# Patient Record
Sex: Female | Born: 1985 | Race: White | Hispanic: No | Marital: Married | Smoking: Never smoker
Health system: Southern US, Community
[De-identification: ages and names within clinical notes are randomized; demographics above are authoritative.]

## PROBLEM LIST (undated history)

## (undated) DIAGNOSIS — D649 Anemia, unspecified: Secondary | ICD-10-CM

## (undated) DIAGNOSIS — T7840XA Allergy, unspecified, initial encounter: Secondary | ICD-10-CM

## (undated) DIAGNOSIS — S0300XA Dislocation of jaw, unspecified side, initial encounter: Secondary | ICD-10-CM

## (undated) HISTORY — DX: Allergy, unspecified, initial encounter: T78.40XA

## (undated) HISTORY — DX: Anemia, unspecified: D64.9

---

## 2008-09-10 ENCOUNTER — Ambulatory Visit (HOSPITAL_BASED_OUTPATIENT_CLINIC_OR_DEPARTMENT_OTHER): Admission: RE | Admit: 2008-09-10 | Discharge: 2008-09-10 | Payer: Self-pay | Admitting: General Surgery

## 2008-09-10 ENCOUNTER — Encounter (INDEPENDENT_AMBULATORY_CARE_PROVIDER_SITE_OTHER): Payer: Self-pay | Admitting: General Surgery

## 2009-12-28 ENCOUNTER — Emergency Department (HOSPITAL_BASED_OUTPATIENT_CLINIC_OR_DEPARTMENT_OTHER): Admission: EM | Admit: 2009-12-28 | Discharge: 2009-12-28 | Payer: Self-pay | Admitting: Emergency Medicine

## 2010-04-05 ENCOUNTER — Emergency Department (HOSPITAL_COMMUNITY): Admission: EM | Admit: 2010-04-05 | Discharge: 2010-04-06 | Payer: Self-pay | Admitting: Emergency Medicine

## 2011-01-04 LAB — URINE MICROSCOPIC-ADD ON

## 2011-01-04 LAB — DIFFERENTIAL
Basophils Absolute: 0 10*3/uL (ref 0.0–0.1)
Basophils Relative: 0 % (ref 0–1)
Eosinophils Absolute: 0 10*3/uL (ref 0.0–0.7)
Eosinophils Relative: 0 % (ref 0–5)
Lymphocytes Relative: 10 % — ABNORMAL LOW (ref 12–46)
Monocytes Absolute: 1.1 10*3/uL — ABNORMAL HIGH (ref 0.1–1.0)

## 2011-01-04 LAB — CBC
HCT: 32 % — ABNORMAL LOW (ref 36.0–46.0)
Hemoglobin: 10.4 g/dL — ABNORMAL LOW (ref 12.0–15.0)
RBC: 3.59 MIL/uL — ABNORMAL LOW (ref 3.87–5.11)
RDW: 13.5 % (ref 11.5–15.5)
WBC: 10.4 10*3/uL (ref 4.0–10.5)

## 2011-01-04 LAB — COMPREHENSIVE METABOLIC PANEL
AST: 18 U/L (ref 0–37)
Alkaline Phosphatase: 60 U/L (ref 39–117)
BUN: 5 mg/dL — ABNORMAL LOW (ref 6–23)
CO2: 26 mEq/L (ref 19–32)
Calcium: 8.9 mg/dL (ref 8.4–10.5)
Chloride: 104 mEq/L (ref 96–112)
Creatinine, Ser: 0.87 mg/dL (ref 0.4–1.2)
GFR calc non Af Amer: >60 mL/min (ref >60)
Glucose, Bld: 107 mg/dL — ABNORMAL HIGH (ref 70–99)
Potassium: 4 mEq/L (ref 3.5–5.1)
Total Bilirubin: 0.4 mg/dL (ref 0.3–1.2)
Total Protein: 6.8 g/dL (ref 6.0–8.3)

## 2011-01-04 LAB — LIPASE, BLOOD: Lipase: 20 U/L (ref 11–59)

## 2011-01-04 LAB — URINALYSIS, ROUTINE W REFLEX MICROSCOPIC
Ketones, ur: NEGATIVE mg/dL
Protein, ur: NEGATIVE mg/dL
Urobilinogen, UA: 0.2 mg/dL (ref 0.0–1.0)

## 2011-01-04 LAB — URINE CULTURE: Colony Count: 85000

## 2011-03-03 NOTE — Op Note (Signed)
NAMENORVA, BOWE NO.:  [id number]   MEDICAL RECORD NO.:  [id number]          PATIENT TYPE:  AMB   LOCATION:  DSC                          FACILITY:  [facility]   PHYSICIAN:  Anselm Pancoast. 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   OPERATION:  Re-excision of old scar.   ANESTHESIA:  Local anesthesia with oral sedation.   SURGEON:  Anselm Pancoast. 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  She had been seen in the [facility].  I think they had also done an ultrasound of the  breast, and it showed a little nodule in the immediate subcutaneous  tissue and skin in this area.  I saw her in the office and recommended  that we just excise the area thinking it was a sebaceous cyst, and when  I made a little ellipse, the area popped out very quickly, and on  opening the area, it was firm and not the cheesy normally sebaceous-cyst  type fluid.  I sent the same for pathology examination, and Dr. Cliffton Asters is  the Pathologist who is the examiner, and he said this is a fibrous  histiocytoma, it is not a malignant variation, and questioned whether it  was completely excised.  The area has healed without any problems.  There was a just a little bit of thickening in the underlying skin, but  with this area of question, recommended that we re-excise it with  margins, and she is here today for the planned procedure.  This at  present is a little incision about a  centimeter and a half in size that  has maybe a little thickening in the immediate area under it but  certainly not an obvious mass and not anything that looks like an  inflammation.  I have discussed with the patient's mother about what we  are planning to do, and mother did come with her today.  We also had  given the patient 2 mg of Valium orally about an hour earlier, and she  is comfortable but talking and appears to be under a light sedation.  The area then after being marked, the patient identified, and time-out  procedure, etc., we draped her sterilely and used about 10 mL of 1%  Xylocaine with adrenaline that I infiltrated in the tissue around the  area.  I then basically ellipsed out the old scar, and the immediate  subcutaneous tissue was under the skin going down, exited to the breast  tissue capsule, but this was not a breast area itself.  After the area  had been completely excised, then we could pull the subcutaneous tissue  over with 4-0 Vicryl and then closed the skin with subcuticular 5-0  Monocryl and then benzoin and Steri-Strips on the skin.  There was  minimal bleeding.  The patient tolerated the procedure without any  problem, and she is ready to be released.  I will see her back in the  office in about 10 days, and she has some extra Steri-Strips, which she  can replace again and hopefully, have as minimal scar as possible.  The specimen was sent for repeat pathology examination, and she was  given some Vicodin tablets if needed for pain.  She will limit her  activities today, but she should be able to attend class tomorrow on  Wednesday and then home for the holidays.           ______________________________  Anselm Pancoast. Zachery Dakins, M.D.     WJW/MEDQ  D:  09/10/2008  T:  09/11/2008  Job:  [id number]

## 2014-10-08 ENCOUNTER — Other Ambulatory Visit: Payer: Self-pay | Admitting: Dentistry

## 2014-10-08 DIAGNOSIS — R6884 Jaw pain: Secondary | ICD-10-CM

## 2014-10-24 ENCOUNTER — Ambulatory Visit
Admission: RE | Admit: 2014-10-24 | Discharge: 2014-10-24 | Disposition: A | Discharge status: Home / Self Care | Payer: BLUE CROSS/BLUE SHIELD | Source: Ambulatory Visit | Attending: Dentistry | Admitting: Dentistry

## 2014-10-24 DIAGNOSIS — R6884 Jaw pain: Secondary | ICD-10-CM

## 2015-12-05 ENCOUNTER — Emergency Department (HOSPITAL_BASED_OUTPATIENT_CLINIC_OR_DEPARTMENT_OTHER)
Admission: EM | Admit: 2015-12-05 | Discharge: 2015-12-06 | Disposition: A | Discharge status: Home / Self Care | Payer: BLUE CROSS/BLUE SHIELD | Attending: Emergency Medicine | Admitting: Emergency Medicine

## 2015-12-05 ENCOUNTER — Emergency Department (HOSPITAL_BASED_OUTPATIENT_CLINIC_OR_DEPARTMENT_OTHER): Payer: BLUE CROSS/BLUE SHIELD

## 2015-12-05 ENCOUNTER — Encounter (HOSPITAL_BASED_OUTPATIENT_CLINIC_OR_DEPARTMENT_OTHER): Payer: Self-pay | Admitting: Emergency Medicine

## 2015-12-05 DIAGNOSIS — R102 Pelvic and perineal pain: Secondary | ICD-10-CM | POA: Insufficient documentation

## 2015-12-05 DIAGNOSIS — Z3202 Encounter for pregnancy test, result negative: Secondary | ICD-10-CM | POA: Diagnosis not present

## 2015-12-05 DIAGNOSIS — Z87828 Personal history of other (healed) physical injury and trauma: Secondary | ICD-10-CM | POA: Diagnosis not present

## 2015-12-05 DIAGNOSIS — R1030 Lower abdominal pain, unspecified: Secondary | ICD-10-CM | POA: Insufficient documentation

## 2015-12-05 DIAGNOSIS — N898 Other specified noninflammatory disorders of vagina: Secondary | ICD-10-CM | POA: Insufficient documentation

## 2015-12-05 HISTORY — DX: Dislocation of jaw, unspecified side, initial encounter: S03.00XA

## 2015-12-05 LAB — WET PREP, GENITAL
Clue Cells Wet Prep HPF POC: NONE SEEN
TRICH WET PREP: NONE SEEN
Yeast Wet Prep HPF POC: NONE SEEN

## 2015-12-05 LAB — URINALYSIS, ROUTINE W REFLEX MICROSCOPIC
Bilirubin Urine: NEGATIVE
GLUCOSE, UA: NEGATIVE mg/dL
Hgb urine dipstick: NEGATIVE
KETONES UR: NEGATIVE mg/dL
LEUKOCYTES UA: NEGATIVE
Nitrite: NEGATIVE
PH: 7 (ref 5.0–8.0)
Protein, ur: NEGATIVE mg/dL
SPECIFIC GRAVITY, URINE: 1.008 (ref 1.005–1.030)

## 2015-12-05 LAB — PREGNANCY, URINE: Preg Test, Ur: NEGATIVE

## 2015-12-05 MED ORDER — KETOROLAC TROMETHAMINE 60 MG/2ML IM SOLN
60.0000 mg | Freq: Once | INTRAMUSCULAR | Status: AC
  Administered 2015-12-06: 60 mg via INTRAMUSCULAR
  Filled 2015-12-05: qty 2

## 2015-12-05 NOTE — ED Provider Notes (Signed)
CSN: [id number]     Arrival date & time 12/05/15  2103 History  By signing my name below, I, Debra Raymond, attest that this documentation has been prepared under the direction and in the presence of Baruch Lewers, MD. Electronically Signed: Phillis Raymond, ED Scribe. 12/05/2015. 11:14 PM.  Chief Complaint  Patient presents with  . Pelvic Pain   Patient is a 30 y.o. female presenting with abdominal pain. The history is provided by the patient. No language interpreter was used.  Abdominal Pain Pain location:  Suprapubic Pain quality: cramping   Pain radiates to:  Does not radiate Pain severity:  Moderate Onset quality:  Gradual Timing:  Constant Progression:  Unchanged Chronicity:  New Context: not alcohol use   Relieved by:  Nothing Worsened by:  Nothing tried Ineffective treatments:  None tried Associated symptoms: no chest pain, no chills, no constipation, no diarrhea, no dysuria, no fever, no hematuria, no nausea, no shortness of breath and no vomiting   HPI Comments: Debra Raymond is a 30 y.o. female who presents to the Emergency Department complaining of lower, central pelvic pain onset 3 hours ago. Pt states that she is not currently on her menstrual period, but began to have mild pain when her period first started 10 days ago. She has not taken anything for her pain. She has a copper IUD in place. Last BM was this morning. She denies fever, chills, constipation, vomiting, diarrhea, frequency, dysuria, hematuria, or malodorous urine.   Past Medical History  Diagnosis Date  . TMJ (dislocation of temporomandibular joint)    History reviewed. No pertinent past surgical history. History reviewed. No pertinent family history. Social History  Substance Use Topics  . Smoking status: Never Smoker   . Smokeless tobacco: None  . Alcohol Use: Yes     Comment: occ   OB History    No data available     Review of Systems  Constitutional: Negative for fever and chills.  Respiratory:  Negative for shortness of breath.   Cardiovascular: Negative for chest pain.  Gastrointestinal: Positive for abdominal pain. Negative for nausea, vomiting, diarrhea and constipation.  Genitourinary: Positive for pelvic pain. Negative for dysuria, urgency, frequency, hematuria and menstrual problem.  All other systems reviewed and are negative.  Allergies  Corn-containing products; Dairy aid; and Gluten meal  Home Medications   Prior to Admission medications   Not on File   BP 125/87 mmHg  Pulse 78  Temp(Src) 98.7 F (37.1 C) (Oral)  Resp 18  Ht  (1.676 m)  Wt 133 lb 5 oz (60.47 kg)  BMI 21.53 kg/m2  SpO2 100%  LMP 11/18/2015 Physical Exam  Constitutional: She is oriented to person, place, and time. She appears well-developed and well-nourished. No distress.  HENT:  Head: Normocephalic and atraumatic.  Mouth/Throat: Oropharynx is clear and moist. No oropharyngeal exudate.  Trachea midline  Eyes: Conjunctivae and EOM are normal. Pupils are equal, round, and reactive to light.  Neck: Trachea normal and normal range of motion. Neck supple. No JVD present. Carotid bruit is not present.  Cardiovascular: Normal rate and regular rhythm.  Exam reveals no gallop and no friction rub.   No murmur heard. Pulmonary/Chest: Effort normal and breath sounds normal. No stridor. She has no wheezes. She has no rales.  Abdominal: Soft. She exhibits no mass. Bowel sounds are increased. There is no tenderness. There is no rebound and no guarding.  Genitourinary: Vaginal discharge found.  Chaperone present; no CMT; no strings present; white  discharge  Musculoskeletal: Normal range of motion.  Lymphadenopathy:    She has no cervical adenopathy.  Neurological: She is alert and oriented to person, place, and time. She has normal reflexes. No cranial nerve deficit. She exhibits normal muscle tone. Coordination normal.  Cranial nerves 2-12 intact  Skin: Skin is warm and dry. She is not diaphoretic.   Psychiatric: She has a normal mood and affect. Her behavior is normal.    ED Course  Procedures (including critical care time) DIAGNOSTIC STUDIES: Oxygen Saturation is 100% on RA, normal by my interpretation.    COORDINATION OF CARE: 11:14 PM-Discussed treatment plan which includes pelvic exam and UA with pt at bedside and pt agreed to plan.    Labs Review Labs Reviewed  URINALYSIS, ROUTINE W REFLEX MICROSCOPIC (NOT AT [facility])  PREGNANCY, URINE    Imaging Review No results found. I have personally reviewed and evaluated these images and lab results as part of my medical decision-making.   EKG Interpretation None      MDM   Final diagnoses:  None    IUd appears to be in position on XRay no free air, exam is benign.  IUD needs to be removed but strings no visualized nor palpable.  COntact your GYN.  Strict return precautions given  I personally performed the services described in this documentation, which was scribed in my presence. The recorded information has been reviewed and is accurate.     Cy Blamer, MD 12/06/15 [phone]

## 2015-12-05 NOTE — ED Notes (Signed)
Patient reports that she is not on her period, but having lower pelvic pain. She also reports that she has a copper IUD.

## 2015-12-06 LAB — GC/CHLAMYDIA PROBE AMP (~~LOC~~) NOT AT ARMC
CHLAMYDIA, DNA PROBE: NEGATIVE
NEISSERIA GONORRHEA: NEGATIVE

## 2015-12-06 MED ORDER — CEFTRIAXONE SODIUM 250 MG IJ SOLR
250.0000 mg | Freq: Once | INTRAMUSCULAR | Status: AC
  Administered 2015-12-06: 250 mg via INTRAMUSCULAR
  Filled 2015-12-06: qty 250

## 2015-12-06 MED ORDER — LIDOCAINE HCL (PF) 1 % IJ SOLN
INTRAMUSCULAR | Status: AC
  Administered 2015-12-06: 2.1 mL
  Filled 2015-12-06: qty 5

## 2015-12-06 MED ORDER — AZITHROMYCIN 1 G PO PACK
1.0000 g | PACK | Freq: Once | ORAL | Status: AC
  Administered 2015-12-06: 1 g via ORAL
  Filled 2015-12-06: qty 1

## 2015-12-06 MED ORDER — NAPROXEN 375 MG PO TABS: 375.0000 mg | ORAL_TABLET | Freq: Two times a day (BID) | ORAL | Status: DC

## 2015-12-06 NOTE — Discharge Instructions (Signed)
°Emergency Department Resource Guide °1) Find a Doctor and Pay Out of Pocket °Although you won't have to find out who is covered by your insurance plan, it is a good idea to ask around and get recommendations. You will then need to call the office and see if the doctor you have chosen will accept you as a new patient and what types of options they offer for patients who are self-pay. Some doctors offer discounts or will set up payment plans for their patients who do not have insurance, but you will need to ask so you aren't surprised when you get to your appointment. ° °2) Contact Your Local Health Department °Not all health departments have doctors that can see patients for sick visits, but many do, so it is worth a call to see if yours does. If you don't know where your local health department is, you can check in your phone book. The CDC also has a tool to help you locate your state's health department, and many state websites also have listings of all of their local health departments. ° °3) Find a Walk-in Clinic °If your illness is not likely to be very severe or complicated, you may want to try a walk in clinic. These are popping up all over the country in pharmacies, drugstores, and shopping centers. They're usually staffed by nurse practitioners or physician assistants that have been trained to treat common illnesses and complaints. They're usually fairly quick and inexpensive. However, if you have serious medical issues or chronic medical problems, these are probably not your best option. ° °No Primary Care Doctor: °- Call Health Connect at  832-8000 - they can help you locate a primary care doctor that  accepts your insurance, provides certain services, etc. °- Physician Referral Service- 1-800-533-3463 ° °Chronic Pain Problems: °Organization         Address  Phone   Notes  °[facility]  (336) 297-2271 Patients need to be referred by their primary care doctor.  ° °Medication  Assistance: °Organization         Address  Phone   Notes  °[place] [facility] [address] °[place], [place] [address] (336) 641-8030 --Must be a resident of [place] °-- Must have NO insurance coverage whatsoever (no Medicaid/ Medicare, etc.) °-- The pt. MUST have a primary care doctor that directs their care regularly and follows them in the community °  °[facility]  (866) 331-1348   °United Way  (888) 892-1162   ° °Agencies that provide inexpensive medical care: °Organization         Address  Phone   Notes  °[facility]  (336) 832-8035   ° Internal Medicine    (336) 832-7272   °[facility] [address] °[place], [place] [address] (336) 832-4777   °[facility] [address], °[place] (336) 271-4999   °[facility]    (336) 373-0678   °[facility]    (336) 272-1050   °[facility] ° [address], [place] Phone:  (336) 832-4444, Fax:  (336) 832-4440 Hours of Operation:  9 am - 6 pm, M-F.  Also accepts Medicaid/Medicare and self-pay.  °[facility] ° [address], [place] Phone: (336) 832-3150, Fax: (336) 832-3151. Hours of Operation:  8:30 am - 5:30 pm, M-F.  Also accepts Medicaid and self-pay.  °[facility] [place] [address], [place] Phone: (336) 878-6027   °[facility] [address], [place], [place] (336)723-1848, Ext. 123 Mondays & Thursdays: 7-9 AM.  First 15 patients are seen on a first come, first serve basis. °  ° °Medicaid-accepting [place] Providers: ° °Organization         Address  Phone   Notes  °[facility] [address], [place] (336) 641-2100 Also accepts self-pay patients.  °[facility] [address], [place] ° (336) 856-9996   °[facility] [address], [place]  (336) 288-8857   °[facility] [address] [place] [address], [place] (336) 299-7000   °Veita Bland [address], [place]  ° (336) 373-1557 Only accepts [place] Access Medicaid patients after they have their name applied to their card.  ° °Self-Pay (no insurance) in [place]: ° °Organization         Address  Phone   Notes  °Sickle Cell Patients, [facility] [address], [place] (336) 832-1970   °[facility] [address], [place] (336) 832-4400   °[facility] ° [address], [place] (336) 992-4800   °[facility]/Dr. Osei-Bonsu ° [address] [place] [address], [place] or [address], [place] (336) 841-8500 Phone number for both [place] and [place] locations is the same.  °[facility] [address], [place] (336) 299-0000   °[facility] [address] [place] [address], [place] or [address] (336) 852-7530 °(336) 878-2260   °[facility] [address], [place] (336) 350-1642, phone; (336) 294-5005, fax Sees patients 1st and 3rd Saturday of every month.  Must not qualify for public or private insurance (i.e. Medicaid, Medicare, [place] Health Choice, Veterans' Benefits) • Household income should be no more than 200% of the poverty level •The clinic cannot treat you if you are pregnant or think you are pregnant • Sexually transmitted diseases are not treated at the clinic.  ° ° °Dental Care: °Organization         Address  Phone  Notes  °[place] [facility] [address], [place] (336) 641-6152 Accepts children up to age 21 who are enrolled in Medicaid or [place] Health Choice; pregnant women with a Medicaid card; and children who have applied for Medicaid or [place] Health Choice, but were declined, whose parents can pay a reduced fee at time of service.  °[place]  [facility] [place]  [address], [place] (336) 641-7733 Accepts children up to age 21 who are enrolled in Medicaid or [place] Health Choice; pregnant women with a Medicaid card; and children who have applied for Medicaid or [place] Health Choice, but were declined, whose parents can pay a reduced fee at time of service.  °[facility] ° [address], [place] (336) 641-4533 Patients are seen by appointment only. Walk-ins are not accepted. [facility] will see patients 18 years of age and older. °Monday - Tuesday (8am-5pm) °Most Wednesdays (8:30-5pm) °$30 per visit, cash only  °[facility] ° [address], [place] (336) 641-4533 Patients are seen by appointment only. Walk-ins are not accepted. [facility] will see patients 18 years of age and older. °One   Wednesday Evening (Monthly: Volunteer Based).  $30 per visit, cash only  °[facility]  (919) 537-3737 for adults; Children under age 4, call Graduate Pediatric Dentistry at (919) [phone]. Children aged 4-14, please call (919) [phone] to request a pediatric application. ° Dental services are provided in all areas of dental care including fillings, crowns and bridges, complete and partial dentures, implants, gum treatment, root canals, and extractions. Preventive care is also provided. Treatment is provided to both adults and children. °Patients are selected via a lottery and there is often a waiting list. °  °[facility] [address], °[place] ° (336) 763-8833 [email or web address] °  °[facility] [address], [place], [place] (336)723-1848, Ext. 123 Second and Fourth Thursday of each month, opens at 6:30 AM; Clinic ends at 9 AM.  Patients are seen on a first-come first-served basis, and a limited number are seen during each clinic.  ° °[facility] ° [address], [place], [place] (336) 723-7904    Eligibility Requirements °You must have lived in [place], [place], or [place] counties for at least the last three months. °  You cannot be eligible for state or federal sponsored healthcare insurance, including Veterans Administration, Medicaid, or Medicare. °  You generally cannot be eligible for healthcare insurance through your employer.  °  How to apply: °Eligibility screenings are held every Tuesday and Wednesday afternoon from 1:00 pm until 4:00 pm. You do not need an appointment for the interview!  °[facility] [address], [place], [place] 336-631-2330   °[facility] [facility]  336-342-8273   °[facility]  336-703-3100   °[facility]  336-570-6415   ° °Behavioral Health Resources in the Community: °Intensive Outpatient Programs °Organization         Address  Phone  Notes  °[facility] [address], [place], [place] 336-878-6098   °[facility] [address], [place], [place] 336-832-9800   °[facility] [address], [place], [place] ° 336-882-2125   °[facility] [address],  °[place], [place] 1-800-853-5163 or 336-641-4981   °Substance Abuse Resources °Organization         Address  Phone  Notes  °[facility]  336-882-2125   °[facility]  336-784-9470   °[facility]  336-285-9073   °[facility]  336-845-3988   °Residential & Outpatient Substance Abuse Program  1-800-659-3381   °Psychological Services °Organization         Address  Phone  Notes  °[facility]  336- 832-9600   °[facility]  336- 378-7881   °[facility] [address], [place] 1-800-853-5163 or 336-641-4981   ° °Mobile Crisis Teams °Organization         Address  Phone  Notes  °[facility], Mobile Crisis Care Unit  1-877-626-1772   °Assertive °[facility] ° [address]  [place], [place] 336-834-9664   °Sharon DeEsch [address] °[place] 336-554-5454   ° °Self-Help/Support Groups °Organization         Address  Phone             Notes  °[facility]  - variety of support groups  336- 373-1402 Call for more information  °Narcotics Anonymous (NA), [facility] [address], °[place]  2 meetings at this location  ° °  Residential Treatment Programs °Organization         Address  Phone  Notes  °[facility] [address],    °[place]  1-866-801-8205   °[facility] ° [address], [place], [place] 704-293-8524   °[facility] [address], [place] 336-845-3988 Admissions: 8am-3pm M-F  °[facility] [address]    °[place], [place] 336-841-1104   °[facility] [address], [place], [place] 336-379-7146   °[facility] [address]  °[place], [place] 336-285-9073   °[facility] Intensive Outpatient [address], [place], [place] 336-852-3033   °[facility] [address]  °[place], [place] 1-877-615-2722 or 336-784-9470   °[facility] ([facility] [address] [place], [place] 336-227-7417 Accepts Medicaid  °[facility] [address]  °[place] 1-800-659-3381 Substance Abuse/Addiction Treatment  ° °[place] Behavioral Health Resources °Organization         Address  Phone  Notes  °[facility]  (888) 581-9988   °Julie Brannon, PhD [address] [place], [place]   (336) 349-5553 or (336) 951-0000   °[facility]   [address] °[place], [place] (336) 349-4454   °[facility] [address], [place], [place] (336) 342-8316 Insurance/Medicaid/sponsorship through [facility]  °[facility] [address]                                    [place], [place] (336) 342-8316 Therapy/tele-psych/case    °[facility] [address]  ° [place], [place] (336) 349-2233    °Dr. Arfeen  (336) 349-4544   °[facility] [place]  [employer] [place] [facility] 1) [address], [place] °2) [address], [place] °3)  [address], [place] (336) 349-3220 °(336) 342-7768 ° °(336) 342-8140   °[place] [facility] (336) 342-1394 or (336) 342-3537 (After Hours)    ° ° °

## 2015-12-06 NOTE — ED Notes (Signed)
Patient transported to X-ray 

## 2016-08-17 ENCOUNTER — Other Ambulatory Visit: Payer: Self-pay | Admitting: Obstetrics and Gynecology

## 2016-09-08 LAB — OB RESULTS CONSOLE GC/CHLAMYDIA
Chlamydia: NEGATIVE
Gonorrhea: NEGATIVE

## 2016-09-08 LAB — OB RESULTS CONSOLE RPR: RPR: NONREACTIVE

## 2016-09-08 LAB — OB RESULTS CONSOLE HEPATITIS B SURFACE ANTIGEN: Hepatitis B Surface Ag: NEGATIVE

## 2016-09-08 LAB — OB RESULTS CONSOLE ABO/RH: RH Type: POSITIVE

## 2016-09-08 LAB — OB RESULTS CONSOLE HIV ANTIBODY (ROUTINE TESTING): HIV: NONREACTIVE

## 2016-10-19 HISTORY — PX: TEMPOROMANDIBULAR JOINT SURGERY: SHX35

## 2016-10-19 NOTE — L&D Delivery Note (Signed)
Delivery Note Patient pushed well for 40 minutes.  At 1:19 PM a viable female was delivered via Vaginal, Spontaneous Delivery (Presentation: Occiput Anterior  ).  APGAR: 9, 9; weight pending .   Placenta status: Spontaneous, in tact.  Cord: 3V  with the following complications: None.  Cord pH: n/a  Anesthesia:  Epidural, 10 cc 1% lidocaine Episiotomy: None Lacerations: Labial, bilateral Suture Repair: 3.0 vicryl rapide Est. Blood Loss (mL): 300  Mom to postpartum.  Baby to Couplet care / Skin to Skin.  Debra Raymond Debra Raymond 03/29/2017, 1:49 PM

## 2017-03-11 ENCOUNTER — Other Ambulatory Visit: Payer: Self-pay | Admitting: Obstetrics and Gynecology

## 2017-03-11 LAB — OB RESULTS CONSOLE GBS: STREP GROUP B AG: NEGATIVE

## 2017-03-28 ENCOUNTER — Inpatient Hospital Stay (HOSPITAL_COMMUNITY)
Admission: AD | Admit: 2017-03-28 | Discharge: 2017-03-30 | DRG: 775 | Disposition: A | Discharge status: Home / Self Care | Payer: BLUE CROSS/BLUE SHIELD | Source: Ambulatory Visit | Attending: Obstetrics and Gynecology | Admitting: Obstetrics and Gynecology

## 2017-03-28 ENCOUNTER — Encounter (HOSPITAL_COMMUNITY): Payer: Self-pay | Admitting: Certified Nurse Midwife

## 2017-03-28 DIAGNOSIS — Z3A39 39 weeks gestation of pregnancy: Secondary | ICD-10-CM | POA: Diagnosis not present

## 2017-03-28 DIAGNOSIS — IMO0002 Reserved for concepts with insufficient information to code with codable children: Secondary | ICD-10-CM | POA: Diagnosis present

## 2017-03-28 DIAGNOSIS — Z3493 Encounter for supervision of normal pregnancy, unspecified, third trimester: Secondary | ICD-10-CM | POA: Diagnosis present

## 2017-03-28 LAB — POCT FERN TEST: POCT Fern Test: POSITIVE

## 2017-03-28 LAB — CBC
HCT: 33.5 % — ABNORMAL LOW (ref 36.0–46.0)
Hemoglobin: 11.5 g/dL — ABNORMAL LOW (ref 12.0–15.0)
MCH: 29.8 pg (ref 26.0–34.0)
MCHC: 34.3 g/dL (ref 30.0–36.0)
MCV: 86.8 fL (ref 78.0–100.0)
PLATELETS: 182 10*3/uL (ref 150–400)
RBC: 3.86 MIL/uL — ABNORMAL LOW (ref 3.87–5.11)
RDW: 12.8 % (ref 11.5–15.5)
WBC: 13.4 10*3/uL — ABNORMAL HIGH (ref 4.0–10.5)

## 2017-03-28 MED ORDER — LACTATED RINGERS IV SOLN: 500.0000 mL | INTRAVENOUS | Status: DC | PRN

## 2017-03-28 MED ORDER — LIDOCAINE HCL (PF) 1 % IJ SOLN
30.0000 mL | INTRAMUSCULAR | Status: DC | PRN
  Administered 2017-03-29: 30 mL via SUBCUTANEOUS
  Filled 2017-03-28: qty 30

## 2017-03-28 MED ORDER — SOD CITRATE-CITRIC ACID 500-334 MG/5ML PO SOLN
30.0000 mL | ORAL | Status: DC | PRN
  Administered 2017-03-29: 30 mL via ORAL
  Filled 2017-03-28: qty 15

## 2017-03-28 MED ORDER — LACTATED RINGERS IV SOLN
INTRAVENOUS | Status: DC
  Administered 2017-03-28 – 2017-03-29 (×2): via INTRAVENOUS

## 2017-03-28 MED ORDER — BUTORPHANOL TARTRATE 1 MG/ML IJ SOLN: 1.0000 mg | INTRAMUSCULAR | Status: DC | PRN

## 2017-03-28 MED ORDER — FLEET ENEMA 7-19 GM/118ML RE ENEM: 1.0000 | ENEMA | RECTAL | Status: DC | PRN

## 2017-03-28 MED ORDER — OXYTOCIN 40 UNITS IN LACTATED RINGERS INFUSION - SIMPLE MED
2.5000 [IU]/h | INTRAVENOUS | Status: DC
  Filled 2017-03-28: qty 1000

## 2017-03-28 MED ORDER — OXYTOCIN BOLUS FROM INFUSION
500.0000 mL | Freq: Once | INTRAVENOUS | Status: AC
  Administered 2017-03-29: 500 mL via INTRAVENOUS

## 2017-03-28 MED ORDER — ONDANSETRON HCL 4 MG/2ML IJ SOLN: 4.0000 mg | Freq: Four times a day (QID) | INTRAMUSCULAR | Status: DC | PRN

## 2017-03-28 NOTE — MAU Provider Note (Signed)
S: Debra Raymond is a 31 y.o. G1P0 at 1356w6d who presents today with leaking of fluid. She states that she had a large gush around 2130. She denies any VB. She confirms fetal movement. Only occasional contractions  O: VSS, afebrile Abdomen: soft, non-tender, gravid External: no lesion Vagina: large amount of pooling of clear fluid  Uterus: AGA FHT: 135, moderate with 15x15 accels, no decels  Toco: about every 5-7 mins  +FERN A/P: Exam for ROM RN will report to attending MD Thressa ShellerHeather Hogan 10:57 PM 03/28/17

## 2017-03-28 NOTE — MAU Note (Signed)
Patient presents to MAU with c/o rupture of membranes. States her water broke around 2130. Had some cramping this morning but no cramping or contractions since. No VB. 1cm in the office last week. + FM

## 2017-03-29 ENCOUNTER — Inpatient Hospital Stay (HOSPITAL_COMMUNITY): Payer: BLUE CROSS/BLUE SHIELD | Admitting: Anesthesiology

## 2017-03-29 ENCOUNTER — Encounter (HOSPITAL_COMMUNITY): Payer: Self-pay

## 2017-03-29 LAB — OB RESULTS CONSOLE RUBELLA ANTIBODY, IGM: RUBELLA: IMMUNE

## 2017-03-29 LAB — RPR: RPR: NONREACTIVE

## 2017-03-29 LAB — TYPE AND SCREEN
ABO/RH(D): A POS
Antibody Screen: NEGATIVE

## 2017-03-29 LAB — ABO/RH: ABO/RH(D): A POS

## 2017-03-29 MED ORDER — ONDANSETRON HCL 4 MG PO TABS: 4.0000 mg | ORAL_TABLET | ORAL | Status: DC | PRN

## 2017-03-29 MED ORDER — TERBUTALINE SULFATE 1 MG/ML IJ SOLN
0.2500 mg | Freq: Once | INTRAMUSCULAR | Status: DC | PRN
  Filled 2017-03-29: qty 1

## 2017-03-29 MED ORDER — PHENYLEPHRINE 40 MCG/ML (10ML) SYRINGE FOR IV PUSH (FOR BLOOD PRESSURE SUPPORT)
80.0000 ug | PREFILLED_SYRINGE | INTRAVENOUS | Status: DC | PRN
  Filled 2017-03-29: qty 5

## 2017-03-29 MED ORDER — SENNOSIDES-DOCUSATE SODIUM 8.6-50 MG PO TABS
2.0000 | ORAL_TABLET | ORAL | Status: DC
  Administered 2017-03-29: 2 via ORAL
  Filled 2017-03-29: qty 2

## 2017-03-29 MED ORDER — PHENYLEPHRINE 40 MCG/ML (10ML) SYRINGE FOR IV PUSH (FOR BLOOD PRESSURE SUPPORT)
80.0000 ug | PREFILLED_SYRINGE | INTRAVENOUS | Status: DC | PRN
  Filled 2017-03-29: qty 10
  Filled 2017-03-29: qty 5

## 2017-03-29 MED ORDER — FENTANYL 2.5 MCG/ML BUPIVACAINE 1/10 % EPIDURAL INFUSION (WH - ANES)
14.0000 mL/h | INTRAMUSCULAR | Status: DC | PRN
  Administered 2017-03-29: 14 mL/h via EPIDURAL
  Filled 2017-03-29: qty 100

## 2017-03-29 MED ORDER — COCONUT OIL OIL
1.0000 "application " | TOPICAL_OIL | Status: DC | PRN
  Administered 2017-03-30: 1 via TOPICAL
  Filled 2017-03-29: qty 120

## 2017-03-29 MED ORDER — OXYCODONE HCL 5 MG PO TABS: 5.0000 mg | ORAL_TABLET | ORAL | Status: DC | PRN

## 2017-03-29 MED ORDER — EPHEDRINE 5 MG/ML INJ
10.0000 mg | INTRAVENOUS | Status: DC | PRN
  Filled 2017-03-29: qty 2

## 2017-03-29 MED ORDER — BENZOCAINE-MENTHOL 20-0.5 % EX AERO
1.0000 "application " | INHALATION_SPRAY | CUTANEOUS | Status: DC | PRN
  Administered 2017-03-29: 1 via TOPICAL
  Filled 2017-03-29: qty 56

## 2017-03-29 MED ORDER — ONDANSETRON HCL 4 MG/2ML IJ SOLN: 4.0000 mg | INTRAMUSCULAR | Status: DC | PRN

## 2017-03-29 MED ORDER — LIDOCAINE HCL (PF) 1 % IJ SOLN
INTRAMUSCULAR | Status: DC | PRN
  Administered 2017-03-29: 4 mL
  Administered 2017-03-29: 6 mL via EPIDURAL

## 2017-03-29 MED ORDER — DIPHENHYDRAMINE HCL 50 MG/ML IJ SOLN: 12.5000 mg | INTRAMUSCULAR | Status: DC | PRN

## 2017-03-29 MED ORDER — OXYTOCIN 40 UNITS IN LACTATED RINGERS INFUSION - SIMPLE MED
1.0000 m[IU]/min | INTRAVENOUS | Status: DC
  Administered 2017-03-29: 2 m[IU]/min via INTRAVENOUS

## 2017-03-29 MED ORDER — LACTATED RINGERS IV SOLN: 500.0000 mL | Freq: Once | INTRAVENOUS | Status: DC

## 2017-03-29 MED ORDER — DIPHENHYDRAMINE HCL 25 MG PO CAPS: 25.0000 mg | ORAL_CAPSULE | Freq: Four times a day (QID) | ORAL | Status: DC | PRN

## 2017-03-29 MED ORDER — OXYCODONE HCL 5 MG PO TABS: 10.0000 mg | ORAL_TABLET | ORAL | Status: DC | PRN

## 2017-03-29 MED ORDER — PRENATAL MULTIVITAMIN CH
1.0000 | ORAL_TABLET | Freq: Every day | ORAL | Status: DC
  Administered 2017-03-29 – 2017-03-30 (×2): 1 via ORAL
  Filled 2017-03-29 (×2): qty 1

## 2017-03-29 MED ORDER — IBUPROFEN 600 MG PO TABS
600.0000 mg | ORAL_TABLET | Freq: Four times a day (QID) | ORAL | Status: DC
  Administered 2017-03-29 – 2017-03-30 (×4): 600 mg via ORAL
  Filled 2017-03-29 (×4): qty 1

## 2017-03-29 MED ORDER — DIBUCAINE 1 % RE OINT: 1.0000 "application " | TOPICAL_OINTMENT | RECTAL | Status: DC | PRN

## 2017-03-29 MED ORDER — SIMETHICONE 80 MG PO CHEW: 80.0000 mg | CHEWABLE_TABLET | ORAL | Status: DC | PRN

## 2017-03-29 MED ORDER — ACETAMINOPHEN 325 MG PO TABS: 650.0000 mg | ORAL_TABLET | ORAL | Status: DC | PRN

## 2017-03-29 MED ORDER — FAMOTIDINE 20 MG PO TABS
20.0000 mg | ORAL_TABLET | Freq: Two times a day (BID) | ORAL | Status: DC
  Administered 2017-03-30: 20 mg via ORAL
  Filled 2017-03-29 (×2): qty 1

## 2017-03-29 MED ORDER — WITCH HAZEL-GLYCERIN EX PADS: 1.0000 "application " | MEDICATED_PAD | CUTANEOUS | Status: DC | PRN

## 2017-03-29 MED ORDER — TETANUS-DIPHTH-ACELL PERTUSSIS 5-2.5-18.5 LF-MCG/0.5 IM SUSP: 0.5000 mL | Freq: Once | INTRAMUSCULAR | Status: DC

## 2017-03-29 NOTE — Progress Notes (Signed)
Comfortable w epidural  BP 109/71   Pulse 81   Temp 98.2 F (36.8 C) (Oral)   Resp 16   Ht 5\' 6"  (1.676 m)   Wt 74.4 kg (164 lb)   BMI 26.47 kg/m  NAD Abd: soft, gravid: EFW 6.5-7# Toco: q3-4 min EFM: 130s, mod var, occ variable decels w ctx.  + accels, + scalp stim w exam SVE: 5/90/-1  A/P:  G1 @ 5979w0d w PROM Cont IOL w pitocin FSR/vtx/gbs neg

## 2017-03-29 NOTE — Anesthesia Pain Management Evaluation Note (Signed)
  CRNA Pain Management Visit Note  Patient: Debra Raymond, 31 y.o., female  "Hello I am a member of the anesthesia team at [facility]. We have an anesthesia team available at all times to provide care throughout the hospital, including epidural management and anesthesia for C-section. I don't know your plan for the delivery whether it a natural birth, water birth, IV sedation, nitrous supplementation, doula or epidural, but we want to meet your pain goals."   1.Was your pain managed to your expectations on prior hospitalizations?   No prior hospitalizations  2.What is your expectation for pain management during this hospitalization?     Epidural  3.How can we help you reach that goal? Epidural imminent. L&D RN in room and aware of patient's desire for epidural.  Record the patient's initial score and the patient's pain goal.   Pain: 8  Pain Goal: 5 [facility] wants you to be able to say your pain was always managed very well.  Katlin Ciszewski L 03/29/2017

## 2017-03-29 NOTE — H&P (Signed)
31 y.o. G1P0 @ 703w0d presents with ROM at 2130 yesterday.  She was ruled in with a positive fern and large pooling.  Otherwise has good fetal movement and no bleeding.  SVE on admission was 1 cm.  She did not start contracting regularly after admission, so was started on pitocin augmentation at 0300.    Past Medical History:  Diagnosis Date  . TMJ (dislocation of temporomandibular joint)    History reviewed. No pertinent surgical history.  OB History  Gravida Para Term Preterm AB Living  1            SAB TAB Ectopic Multiple Live Births               # Outcome Date GA Lbr Len/2nd Weight Sex Delivery Anes PTL Lv  1 Current               Social History   Social History  . Marital status: Married    Spouse name: N/A  . Number of children: N/A  . Years of education: N/A   Occupational History  . Not on file.   Social History Main Topics  . Smoking status: Never Smoker  . Smokeless tobacco: Not on file  . Alcohol use Yes     Comment: occ  . Drug use: Unknown  . Sexual activity: Yes   Other Topics Concern  . Not on file   Social History Narrative  . No narrative on file   Corn-containing products; Dairy aid [lactase]; and Gluten meal    Prenatal Transfer Tool  Maternal Diabetes: No Genetic Screening: Normal Maternal Ultrasounds/Referrals: Normal Fetal Ultrasounds or other Referrals:  None Maternal Substance Abuse:  No Significant Maternal Medications:  Meds include: Zantac Significant Maternal Lab Results: Lab values include: Group B Strep negative, Other:  Zika virus IgM neg  ABO, Rh: --/--/A POS, A POS (06/10 2311) Antibody: NEG (06/10 2311) Rubella:  Immune RPR: Nonreactive (11/21 0000)  HBsAg: Negative (11/21 0000)  HIV: Non-reactive (11/21 0000)  GBS: Negative (05/24 0000)     Other PNC: uncomplicated.    Vitals:   03/29/17 0601 03/29/17 0631  BP: 111/65 112/71  Pulse: 68 63  Resp: 18 18  Temp: 98.3 F (36.8 C)      General:  NAD Abdomen:   soft, gravid Ex:  no edema SVE:  2/50/high per RN FHTs:  125, mod var, + accels, no decel Toco:  q3-4 min   A/P   31 y.o. G1P0 593w0d presents with PROM Cont pitocin augmentation Epidural now per pt request  FSR/ vtx/ GBS neg  Johan Antonacci GEFFEL Makoa Satz

## 2017-03-29 NOTE — Anesthesia Postprocedure Evaluation (Signed)
Anesthesia Post Note  Patient: Debra Raymond  Procedure(s) Performed: * No procedures listed *     Patient location during evaluation: Mother Baby Anesthesia Type: Epidural Level of consciousness: awake and alert and oriented Pain management: pain level controlled Vital Signs Assessment: post-procedure vital signs reviewed and stable Respiratory status: spontaneous breathing and nonlabored ventilation Cardiovascular status: stable Postop Assessment: no headache, patient able to bend at knees, no backache, no signs of nausea or vomiting, epidural receding and adequate PO intake Anesthetic complications: no    Last Vitals:  Vitals:   03/29/17 1540 03/29/17 1640  BP: 112/68 118/76  Pulse: 86 80  Resp: 18 18  Temp: 37.2 C     Last Pain:  Vitals:   03/29/17 1640  TempSrc:   PainSc: 2    Pain Goal:                 Land O'LakesMalinova,Ovid Witman Hristova

## 2017-03-29 NOTE — Lactation Note (Signed)
This note was copied from a baby's chart. Lactation Consultation Note  Patient Name: Debra Raymond ZOXWR'UToday's Date: 03/29/2017 Reason for consult: Initial assessment   Initial consult at 5 hrs old; GA 39.0; BW 6 lbs, 12.6 oz.  Mom is a P1 Infant has breastfed x2 (10-20 min) since birth; voids-0; stools-0; LS-9 by RN. Mom stated infant had just fed for 10 minutes on right side but had difficulty getting her latched to right. Reviewed hand expression with mom independently hand expressing as RN had already shown her; colostrum easily flowing and dripping from breast.  LC encouraged using colostrum rubbing onto nipple for breast care and pre- to start flow. LC offered help; mom consented.  Mom attempted latching swaddled infant in cradle hold with body not in alignment to moms and was allowing infant to self latch. LC taught cross-cradle hold, sandwiching of breast, and asymmetrical latching technique.  Taught dad how to assist using teacup hold. Infant easily latched, fed in a consistent sucking pattern for 20 minutes and then came off.  LS-8.   Parents very receptive to teaching and asked LC to show them how to use their [employer] DEBP they received from insurance company.   LC set up pump, but encouraged parents to read manual to figure out the different settings on the pump. Educated on size of infant's stomach, cluster feeding, and continuing to feed with feeding cues.  Encouraged to exclusively breastfeed to build a good milk supply. Lactation brochure given and informed of hospital support group and OP services. Encouraged to call for assistance as needed.     Maternal Data Has patient been taught Hand Expression?: Yes (independently HE; colostrum easily flowing and dripping) Does the patient have breastfeeding experience prior to this delivery?: No  Feeding Feeding Type: Breast Fed Length of feed: 20 min  LATCH Score/Interventions Latch: Grasps breast easily, tongue down, lips  flanged, rhythmical sucking.  Audible Swallowing: A few with stimulation Intervention(s): Skin to skin;Hand expression  Type of Nipple: Everted at rest and after stimulation  Comfort (Breast/Nipple): Soft / non-tender     Hold (Positioning): Assistance needed to correctly position infant at breast and maintain latch. Intervention(s): Breastfeeding basics reviewed;Support Pillows;Skin to skin  LATCH Score: 8  Lactation Tools Discussed/Used WIC Program: No   Consult Status Consult Status: Follow-up Date: 03/30/17 Follow-up type: In-patient    Debra Raymond, Debra Raymond 03/29/2017, 6:28 PM

## 2017-03-29 NOTE — Anesthesia Procedure Notes (Signed)
Epidural Patient location during procedure: OB  Staffing Anesthesiologist: Debra Raymond, Debra Raymond  Preanesthetic Checklist Completed: patient identified, site marked, surgical consent, pre-op evaluation, timeout performed, IV checked, risks and benefits discussed and monitors and equipment checked  Epidural Patient position: sitting Prep: site prepped and draped and DuraPrep Patient monitoring: continuous pulse ox and blood pressure Approach: midline Location: L2-L3 Injection technique: LOR air  Needle:  Needle type: Tuohy  Needle gauge: 17 G Needle length: 9 cm and 9 Needle insertion depth: 4 cm Catheter type: closed end flexible Catheter size: 19 Gauge Catheter at skin depth: 10 cm Test dose: negative  Assessment Events: blood not aspirated, injection not painful, no injection resistance, negative IV test and no paresthesia  Additional Notes Dosing of Epidural:  1st dose, through catheter ............................................Marland Kitchen.  Xylocaine 40 mg  2nd dose, through catheter, after waiting 3 minutes........Marland Kitchen.Xylocaine 60 mg    As each dose occurred, patient was free of IV sx; and patient exhibited no evidence of SA injection.  Patient is more comfortable after epidural dosed. Please see RN's note for documentation of vital signs,and FHR which are stable.  Patient reminded not to try to ambulate with numb legs, and that an RN must be present when she attempts to get up.

## 2017-03-29 NOTE — Anesthesia Preprocedure Evaluation (Signed)

## 2017-03-30 LAB — CBC
HEMATOCRIT: 31.8 % — AB (ref 36.0–46.0)
HEMOGLOBIN: 11 g/dL — AB (ref 12.0–15.0)
MCH: 30.2 pg (ref 26.0–34.0)
MCHC: 34.6 g/dL (ref 30.0–36.0)
MCV: 87.4 fL (ref 78.0–100.0)
Platelets: 184 10*3/uL (ref 150–400)
RBC: 3.64 MIL/uL — ABNORMAL LOW (ref 3.87–5.11)
RDW: 13.2 % (ref 11.5–15.5)
WBC: 15.2 10*3/uL — ABNORMAL HIGH (ref 4.0–10.5)

## 2017-03-30 MED ORDER — IBUPROFEN 600 MG PO TABS: 600.0000 mg | ORAL_TABLET | Freq: Four times a day (QID) | ORAL | 0 refills | Status: DC

## 2017-03-30 NOTE — Progress Notes (Signed)
PPD# 1 Pt and baby doing well. She would like to go home. VSSAF IMP/ Stable  Plan/ Will discharge

## 2017-03-30 NOTE — Lactation Note (Signed)
This note was copied from a baby's chart. Lactation Consultation Note: Mother reports that infant is feeding well. She also reports that her nipples are slightly sore. Advised mother to get infant latched on with good depth. Mother reports that infant is on well. She denies seeing any pinched or compressed areas. Mother encouraged to wait for infant to open mouth wide and to rotate positions frequently. Mother advised to cue base feed infant and to feed at least 8-12 times in 24 hours. Mother encouraged to do good breast massage and to ice breast when firm and full. Mother was given a harmony hand pump and advised to use to prepump as needed. Mother is aware of available LC services, [facility] and outpatient dept. Mother receptive to all teaching.   Patient Name: Girl Morey Hummingbirdara Reesman ZOXWR'UToday's Date: 03/30/2017 Reason for consult: Follow-up assessment   Maternal Data    Feeding Feeding Type: Breast Fed Length of feed: 25 min  LATCH Score/Interventions                      Lactation Tools Discussed/Used     Consult Status Consult Status: Complete    Michel BickersKendrick, Christena Sunderlin McCoy 03/30/2017, 11:28 AM

## 2017-03-30 NOTE — Discharge Summary (Signed)
Obstetric Discharge Summary Reason for Admission: rupture of membranes Prenatal Procedures: ultrasound Intrapartum Procedures: spontaneous vaginal delivery Postpartum Procedures: none Complications-Operative and Postpartum: bilateral labial tears Hemoglobin  Date Value Ref Range Status  03/30/2017 11.0 (L) 12.0 - 15.0 g/dL Final   HCT  Date Value Ref Range Status  03/30/2017 31.8 (L) 36.0 - 46.0 % Final    Physical Exam:  General: alert and cooperative Lochia: appropriate Uterine Fundus: firm   Discharge Diagnoses: Term Pregnancy-delivered  Discharge Information: Date: 03/30/2017 Activity: pelvic rest Diet: routine Medications: None and Ibuprofen Condition: stable Instructions: refer to practice specific booklet Discharge to: home Follow-up Information    Marlow Baarslark, Dyanna, MD. Schedule an appointment as soon as possible for a visit in 1 month(s).   Specialty:  Obstetrics Contact information: [address] [place] [address] [phone]           Newborn Data: Live born female  Birth Weight: 6 lb 12.6 oz (3080 g) APGAR: 9, 9  Home with mother.  Zaiyah Sottile E 03/30/2017, 8:47 AM

## 2017-05-12 ENCOUNTER — Telehealth (HOSPITAL_COMMUNITY): Payer: Self-pay | Admitting: Lactation Services

## 2017-05-12 NOTE — Telephone Encounter (Signed)
Mom called in and left message that she feels she is clogged in both breasts. She gave permission to leave a message. Her child was born in June. .   LM for mom in regards to plugged ducts and to feed/pump every 2 hours using moist warm compresses to areas for 10-20 minutes before pumping/feeding. Massage to the area with feeding. Pointing infant nose/chin toward clogged areas with feeding. Mentioned electric tooth brush/vibrator to the area to dislodge clogs.   Discussed that infrequent feeding, under wire bras, seat belts, carrying infant in the same position can be some of the causes of plugged ducts.   Advised mom to call OB if flu like symptoms or fever develops. Enc mom to call back with further questions/concens.

## 2020-02-25 ENCOUNTER — Emergency Department (INDEPENDENT_AMBULATORY_CARE_PROVIDER_SITE_OTHER): Payer: BC Managed Care – PPO

## 2020-02-25 ENCOUNTER — Emergency Department
Admission: EM | Admit: 2020-02-25 | Discharge: 2020-02-25 | Disposition: A | Discharge status: Home / Self Care | Payer: BLUE CROSS/BLUE SHIELD | Source: Home / Self Care | Attending: Family Medicine | Admitting: Family Medicine

## 2020-02-25 ENCOUNTER — Other Ambulatory Visit: Payer: Self-pay

## 2020-02-25 DIAGNOSIS — R109 Unspecified abdominal pain: Secondary | ICD-10-CM | POA: Diagnosis not present

## 2020-02-25 DIAGNOSIS — R1033 Periumbilical pain: Secondary | ICD-10-CM

## 2020-02-25 LAB — POCT CBC W AUTO DIFF (K'VILLE URGENT CARE)

## 2020-02-25 LAB — POCT URINE PREGNANCY: Preg Test, Ur: NEGATIVE

## 2020-02-25 NOTE — ED Provider Notes (Signed)
[facility]    CSN: [id number] Arrival date & time: 02/25/20  0950      History   Chief Complaint Chief Complaint  Patient presents with  . Abdominal Pain    HPI Debra Raymond is a 34 y.o. female.   Two weeks ago after performing some new and strenuous work-out activities, patient awoke with nausea, fatigue, and an episode of vomiting.  Her nausea persisted and she began to have increasing mid-abdominal radiating pain that is worse with physical activities such as lifting her 28 pound child.  Her pain has also become worse after eating, lasting for about 45 minutes.  She denies changes in bowel movements, but admits that she has abdominal cramps when initiating bowel movements.  She denies urinary symptoms.  No LMP recorded. (Menstrual status: IUD). She denies vaginal discharge and pelvic pain. She visited her PCP two days ago, where an abdominal U/S was negative, pregnancy test negative, CBC normal, and CMP normal.  She has had decreased nausea after beginning Zofran ODT. She reports a past history of celiac disease.  Her mother has colonic polyps and patient has been advised in the past to have a colonoscopy.  The history is provided by the patient.  Abdominal Pain Pain location:  Periumbilical Pain quality: cramping   Pain radiates to:  Does not radiate Pain severity:  Moderate Onset quality:  Sudden Duration:  2 weeks Timing:  Intermittent Progression:  Worsening Chronicity:  New Context: awakening from sleep and eating   Context: not alcohol use, not diet changes, not laxative use, not medication withdrawal, not previous surgeries, not recent illness, not recent travel, not sick contacts, not suspicious food intake and not trauma   Relieved by:  Nothing Worsened by:  Coughing, movement, palpation, deep breathing, position changes, bowel movements and eating Ineffective treatments:  None tried Associated symptoms: anorexia, nausea and vomiting   Associated  symptoms: no belching, no chest pain, no chills, no constipation, no cough, no diarrhea, no dysuria, no fatigue, no fever, no flatus, no hematemesis, no hematochezia, no hematuria, no melena, no shortness of breath, no sore throat, no vaginal bleeding and no vaginal discharge     Past Medical History:  Diagnosis Date  . TMJ (dislocation of temporomandibular joint)     Patient Active Problem List   Diagnosis Date Noted  . Rupture of membranes with clear amniotic fluid 03/28/2017    History reviewed. No pertinent surgical history.  OB History    Gravida  1   Para  1   Term  1   Preterm      AB      Living  1     SAB      TAB      Ectopic      Multiple  0   Live Births  1            Home Medications    Prior to Admission medications   Medication Sig Start Date End Date Taking? Authorizing Provider  ondansetron (ZOFRAN-ODT) 8 MG disintegrating tablet Take 8 mg by mouth every 8 (eight) hours as needed for nausea or vomiting.   Yes [provider]  traMADol (ULTRAM) 50 MG tablet Take 50 mg by mouth every 6 (six) hours as needed.   Yes [provider]  calcium carbonate (TUMS - DOSED IN MG ELEMENTAL CALCIUM) 500 MG chewable tablet Chew 2 tablets by mouth 2 (two) times daily as needed for indigestion or heartburn.  [provider]  ibuprofen (ADVIL,MOTRIN) 600 MG tablet Take 1 tablet (600 mg total) by mouth every 6 (six) hours. 03/30/17   Olga Millers, MD  Prenatal Vit-Fe Fumarate-FA (PRENATAL MULTIVITAMIN) TABS tablet Take 1 tablet by mouth daily at 12 noon.    [provider]    Family History Family History  Problem Relation Age of Onset  . Healthy Mother   . Diabetes Father   . Cancer Father        basal cell and melanoma    Social History Social History   Tobacco Use  . Smoking status: Never Smoker  . Smokeless tobacco: Never Used  Substance Use Topics  . Alcohol use: Yes    Comment: occ  . Drug use: Not  on file     Allergies   Corn-containing products, Dairy aid [lactase], and Gluten meal   Review of Systems Review of Systems  Constitutional: Positive for activity change, appetite change and unexpected weight change. Negative for chills, diaphoresis, fatigue and fever.  HENT: Negative for sore throat.   Eyes: Negative.   Respiratory: Negative for cough and shortness of breath.   Cardiovascular: Negative for chest pain.  Gastrointestinal: Positive for abdominal pain, anorexia, nausea and vomiting. Negative for abdominal distention, anal bleeding, blood in stool, constipation, diarrhea, flatus, hematemesis, hematochezia, melena and rectal pain.  Genitourinary: Negative for dysuria, hematuria, vaginal bleeding and vaginal discharge.  Musculoskeletal: Negative.   Skin: Negative.   Neurological: Negative.      Physical Exam Triage Vital Signs ED Triage Vitals  Enc Vitals Group     BP 02/25/20 1003 118/76     Pulse Rate 02/25/20 1003 73     Resp 02/25/20 1003 16     Temp 02/25/20 1003 98.4 F (36.9 C)     Temp Source 02/25/20 1003 Oral     SpO2 02/25/20 1003 100 %     Weight --      Height --      Head Circumference --      Peak Flow --      Pain Score 02/25/20 1000 5     Pain Loc --      Pain Edu? --      Excl. in [place]? --    No data found.  Updated Vital Signs BP 118/76 (BP Location: Right Arm)   Pulse 73   Temp 98.4 F (36.9 C) (Oral)   Resp 16   SpO2 100%   Visual Acuity Right Eye Distance:   Left Eye Distance:   Bilateral Distance:    Right Eye Near:   Left Eye Near:    Bilateral Near:     Physical Exam Vitals and nursing note reviewed.  Constitutional:      General: She is not in acute distress.    Appearance: She is not ill-appearing.  HENT:     Head: Normocephalic.     Right Ear: External ear normal.     Left Ear: External ear normal.     Nose: Nose normal.     Mouth/Throat:     Pharynx: Oropharynx is clear.  Eyes:     Conjunctiva/sclera:  Conjunctivae normal.     Pupils: Pupils are equal, round, and reactive to light.  Cardiovascular:     Rate and Rhythm: Normal rate.     Heart sounds: Normal heart sounds.  Pulmonary:     Breath sounds: Normal breath sounds.  Abdominal:     General: Abdomen is flat. Bowel sounds  are normal. There is no distension.     Palpations: Abdomen is soft. There is no hepatomegaly, splenomegaly or mass.     Tenderness: There is abdominal tenderness in the periumbilical area. There is no right CVA tenderness, left CVA tenderness or guarding. Negative signs include McBurney's sign.     Hernia: There is no hernia in the umbilical area.       Comments: Abdominal tenderness to palpation as noted on diagram.   Musculoskeletal:     Cervical back: Neck supple.     Right lower leg: No edema.     Left lower leg: No edema.  Lymphadenopathy:     Cervical: No cervical adenopathy.  Skin:    General: Skin is warm and dry.     Findings: No rash.  Neurological:     Mental Status: She is alert.      UC Treatments / Results  Labs (all labs ordered are listed, but only abnormal results are displayed) Labs Reviewed  TSH  POCT CBC W AUTO DIFF ([facility]):  WBC 4.3; LY 29.2; MO 2.9; GR 67.9; Hgb 13.9; Platelets 172   POCT URINE PREGNANCY negative    EKG   Radiology DG Abd 2 Views  Result Date: 02/25/2020 CLINICAL DATA:  Pain with nausea and vomiting EXAM: ABDOMEN - 2 VIEW COMPARISON:  December 06, 2015 FINDINGS: Supine and upright images were obtained. There is of moderate stool throughout the colon. There is no bowel dilatation or air-fluid level to suggest bowel obstruction. No free air. Lung bases are clear. There is an intrauterine device in the pelvis. IMPRESSION: No bowel obstruction or free air. Moderate stool in colon. Lung bases clear. Intrauterine device in mid pelvis. Electronically Signed   By: Bretta Bang III M.D.   On: 02/25/2020 11:30    Procedures Procedures (including  critical care time)  Medications Ordered in UC Medications - No data to display  Initial Impression / Assessment and Plan / UC Course  I have reviewed the triage vital signs and the nursing notes.  Pertinent labs & imaging results that were available during my care of the patient were reviewed by me and considered in my medical decision making (see chart for details).    Normal CBC reassuring.  Also note evaluation 2 days ago with negative abdominal U/S, negative pregnancy test, normal CBC, and normal CMP. Note unremarkable 2 view abdomen except for moderate stool in colon. Suspect constipation contributing to patient's symptoms. Will check TSH.  Begin Miralax. Because of her history of celiac disease and family history of colon polyps (mother), recommend follow-up/evaluation with GI.    Final Clinical Impressions(s) / UC Diagnoses   Final diagnoses:  Periumbilical abdominal pain     Discharge Instructions     Recommend taking daily Miralax.  Begin with approximately 1/2 capful mixed in 4 to 8 ounces of water until bowel movements are regular. Gradually increase dose to one capful daily.  Recommend increasing natural fiber in diet.  May also take a daily fiber product such as Citrucel with plenty of fluid.   If symptoms become significantly worse during the night or over the weekend, proceed to the local emergency room.    ED Prescriptions    None        Lattie Haw, MD 02/25/20 1339

## 2020-02-25 NOTE — Discharge Instructions (Addendum)
Recommend taking daily Miralax.  Begin with approximately 1/2 capful mixed in 4 to 8 ounces of water until bowel movements are regular. Gradually increase dose to one capful daily.  Recommend increasing natural fiber in diet.  May also take a daily fiber product such as Citrucel with plenty of fluid.   If symptoms become significantly worse during the night or over the weekend, proceed to the local emergency room.

## 2020-02-25 NOTE — ED Triage Notes (Signed)
Patient presents to Urgent Care with complaints of nausea, vomiting, and abdominal pain since two weeks ago. Patient reports she went to her PCP and they told her to be evaluated at [facility], where she was evaluated for gallstones (negative). Pt still does not feel well and does not know the cause.

## 2020-02-26 LAB — TSH: TSH: 0.68 mIU/L

## 2021-10-28 IMAGING — DX DG ABDOMEN 2V
2 series · 2 of 2 positions shown · non-contrast
Comparison: December 06, 2015

CLINICAL DATA: Pain with nausea and vomiting

EXAM:
ABDOMEN - 2 VIEW

[abdomen erect]
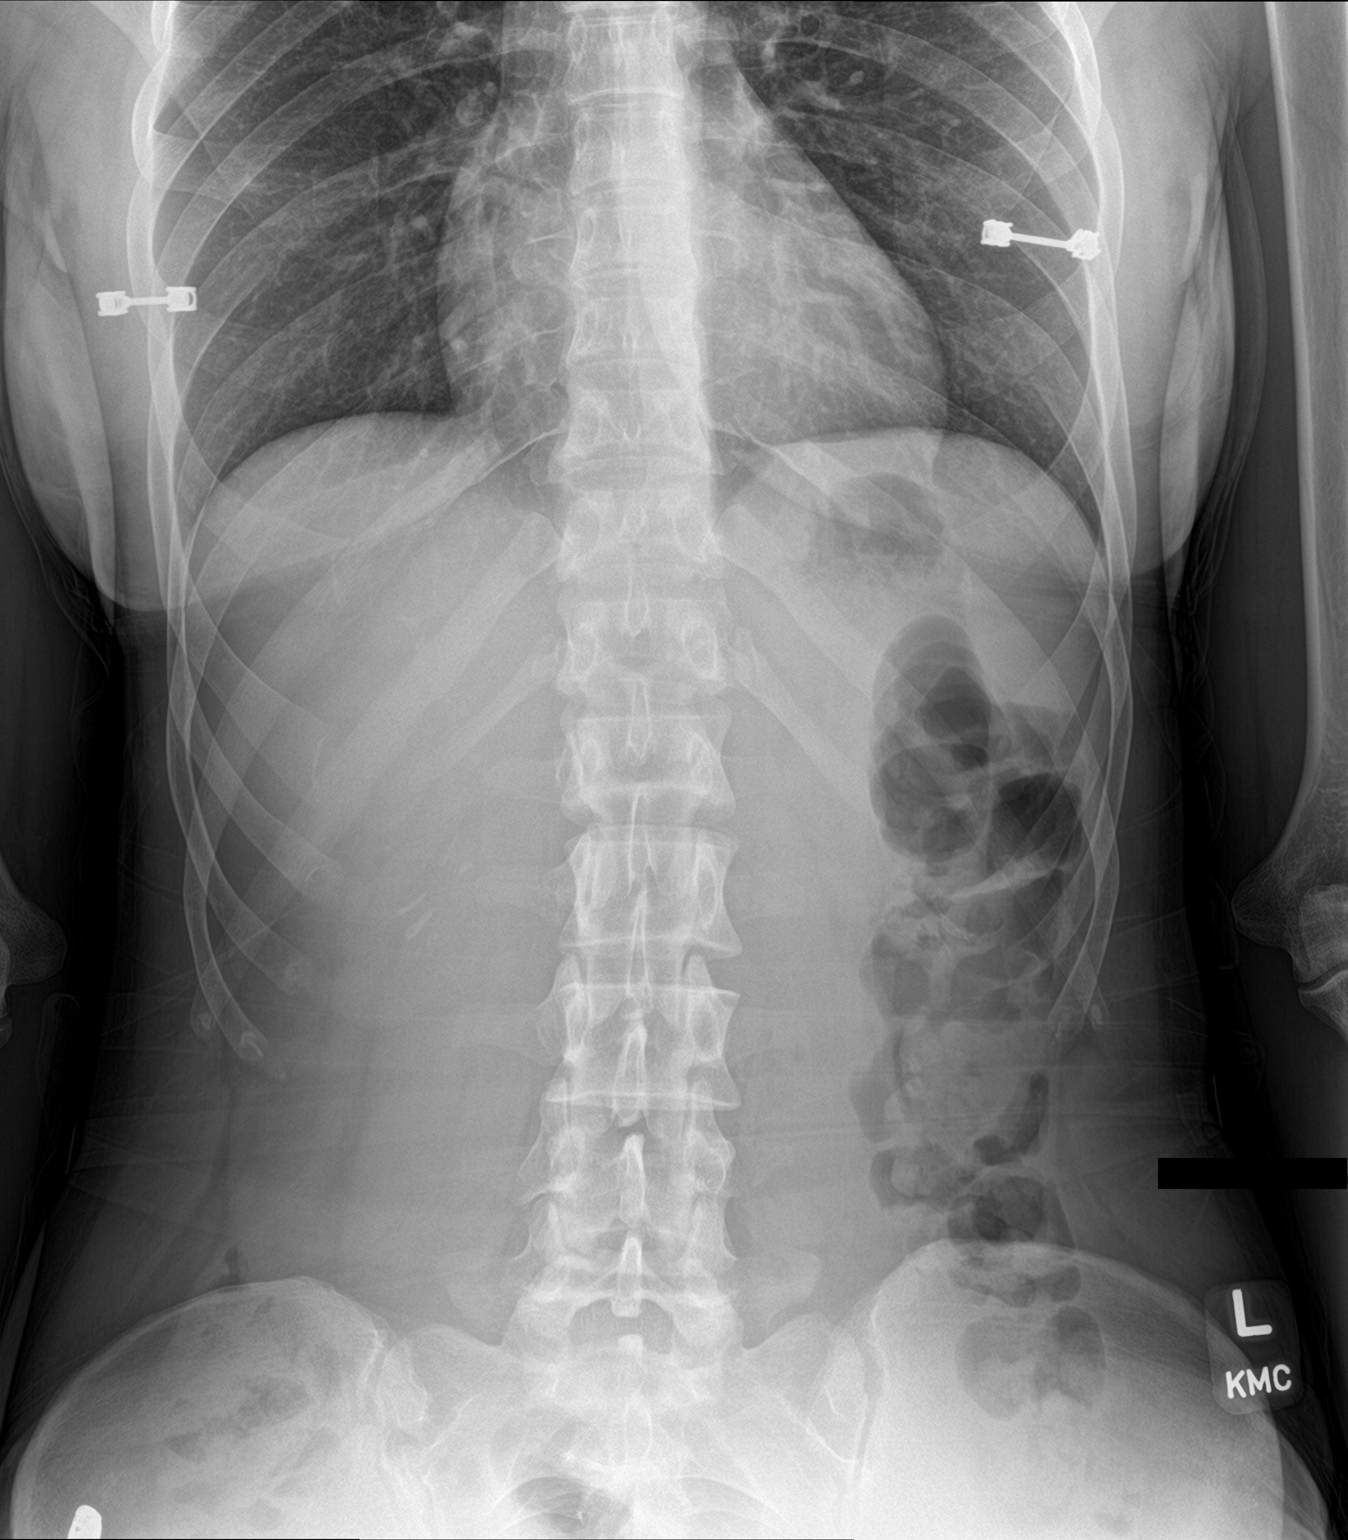

[abdomen supine]
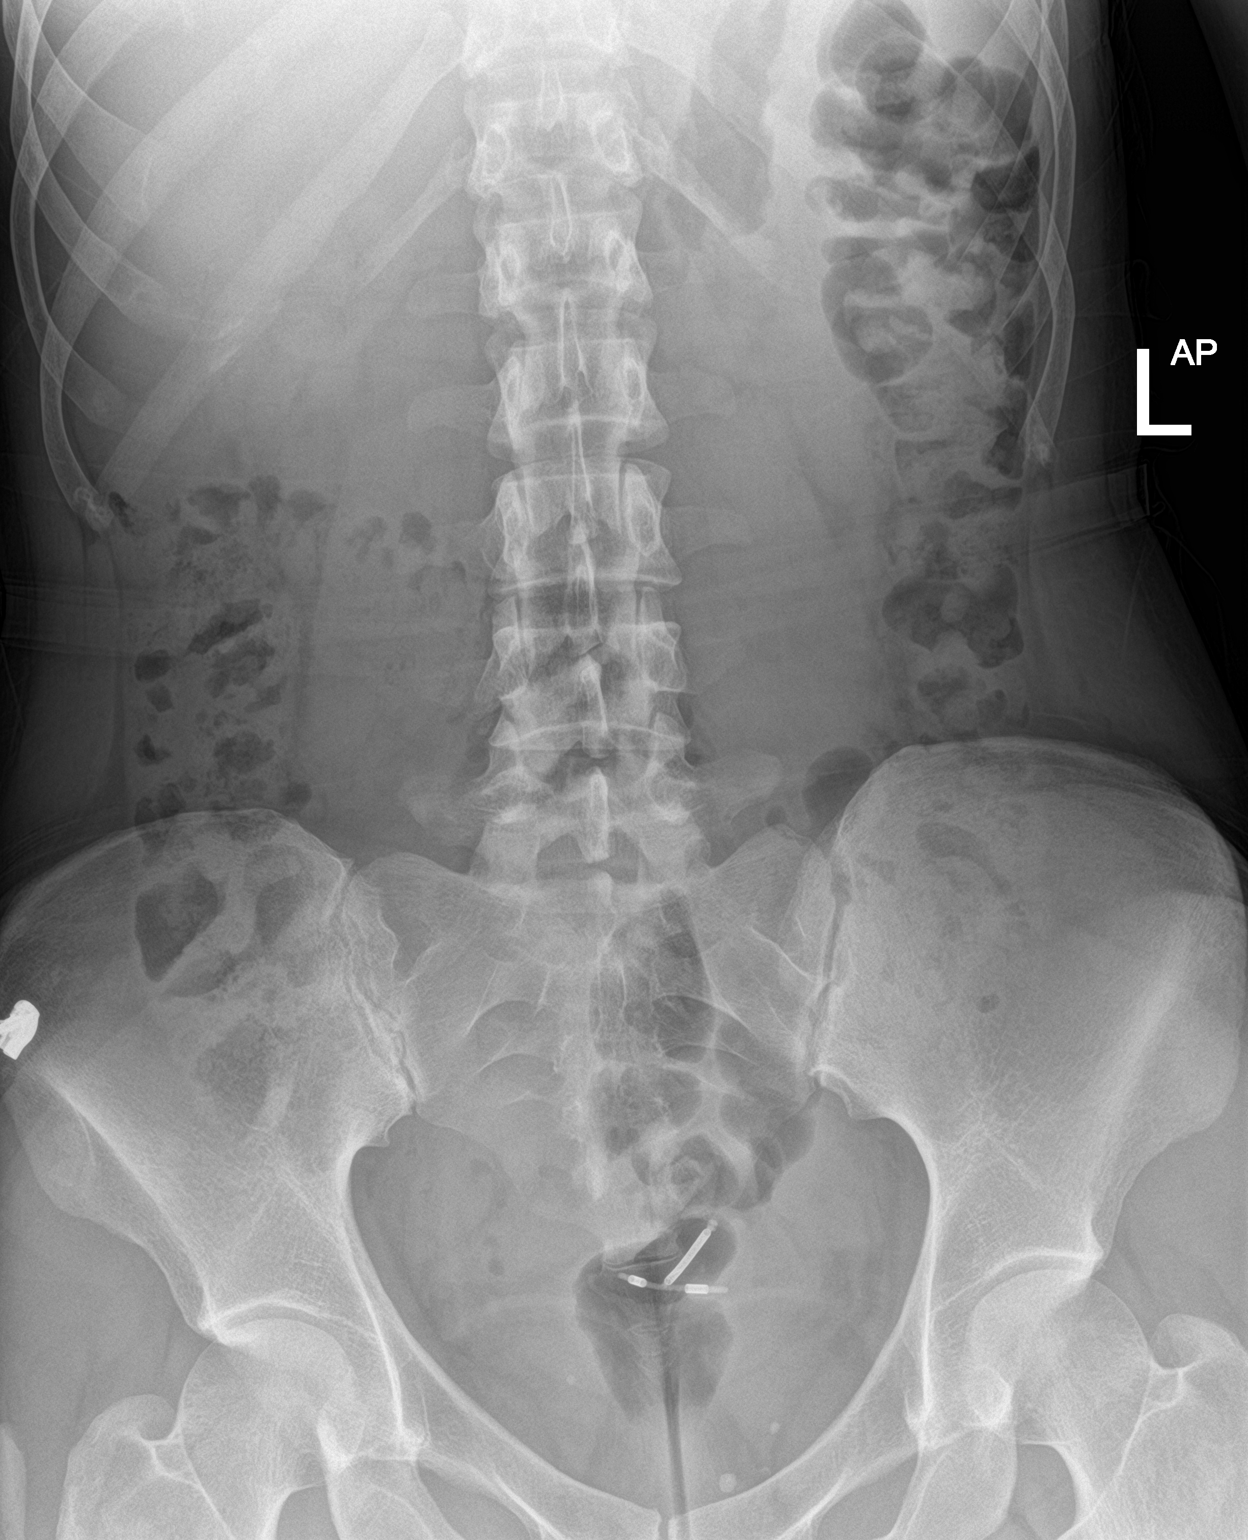

[2 of 2 positions shown; findings below may reference images not displayed]

FINDINGS: Supine and upright images were obtained. There is of moderate stool
throughout the colon. There is no bowel dilatation or air-fluid
level to suggest bowel obstruction. No free air. Lung bases are
clear. There is an intrauterine device in the pelvis.
IMPRESSION: No bowel obstruction or free air. Moderate stool in colon. Lung
bases clear. Intrauterine device in mid pelvis.

## 2022-01-08 ENCOUNTER — Encounter: Payer: Self-pay | Admitting: Emergency Medicine

## 2022-01-08 ENCOUNTER — Emergency Department
Admission: EM | Admit: 2022-01-08 | Discharge: 2022-01-08 | Disposition: A | Discharge status: Home / Self Care | Payer: BC Managed Care – PPO | Source: Home / Self Care | Attending: Family Medicine | Admitting: Family Medicine

## 2022-01-08 ENCOUNTER — Other Ambulatory Visit: Payer: Self-pay

## 2022-01-08 ENCOUNTER — Emergency Department (INDEPENDENT_AMBULATORY_CARE_PROVIDER_SITE_OTHER): Payer: BC Managed Care – PPO

## 2022-01-08 DIAGNOSIS — R1033 Periumbilical pain: Secondary | ICD-10-CM | POA: Diagnosis not present

## 2022-01-08 DIAGNOSIS — Z8719 Personal history of other diseases of the digestive system: Secondary | ICD-10-CM

## 2022-01-08 DIAGNOSIS — K5900 Constipation, unspecified: Secondary | ICD-10-CM

## 2022-01-08 LAB — POCT URINALYSIS DIP (MANUAL ENTRY)
Bilirubin, UA: NEGATIVE
Blood, UA: NEGATIVE
Glucose, UA: NEGATIVE mg/dL
Ketones, POC UA: NEGATIVE mg/dL
Leukocytes, UA: NEGATIVE
Nitrite, UA: NEGATIVE
Protein Ur, POC: NEGATIVE mg/dL
Spec Grav, UA: 1.01 (ref 1.010–1.025)
Urobilinogen, UA: 0.2 E.U./dL
pH, UA: 7 (ref 5.0–8.0)

## 2022-01-08 LAB — POCT URINE PREGNANCY: Preg Test, Ur: NEGATIVE

## 2022-01-08 MED ORDER — DICYCLOMINE HCL 20 MG PO TABS: 20.0000 mg | ORAL_TABLET | Freq: Two times a day (BID) | ORAL | 0 refills | Status: DC

## 2022-01-08 NOTE — ED Triage Notes (Signed)
Pt states this am she developed sudden sharp shooting pain behind her belly button that is intermittent and happens about every 10 minutes. Denies any strenous activity and GI symptoms. Normal BM this am.  ?

## 2022-01-08 NOTE — Discharge Instructions (Signed)
I have prescribed Bentyl to try for the abdominal cramping ?I recommend that you take a dose of MiraLAX and increase your water intake ?If you have increasing pain, nausea vomiting, or fever you may need to come back or go to the emergency room for additional imaging ?Sure you are drinking lots of water ?

## 2022-01-08 NOTE — ED Provider Notes (Addendum)
?[place] ? ? ? ?CSN: [id number] ?Arrival date & time: 01/08/22  1014 ? ? ?  ? ?History   ?Chief Complaint ?Chief Complaint  ?Patient presents with  ? Abdominal Pain  ? ? ?HPI ?Debra Raymond is a 36 y.o. female.  ? ?HPI ?Patient is a generally healthy 36 year old woman.  She states that this morning she developed abdominal pain.  It was right under her bellybutton.  It was quite severe.  It comes in waves.  It is not constant.  No nausea or vomiting.  No fever or chills.  She does have a history of IBS in the past.  Also has a history of celiac disease and is on a restricted diet.  She has not eaten anything that might be causing her symptoms.  She states she had a bowel movement this morning.  No urinary symptoms.  Patient has regular menstrual periods and is not pregnant. ?Past Medical History:  ?Diagnosis Date  ? TMJ (dislocation of temporomandibular joint)   ? ? ?Patient Active Problem List  ? Diagnosis Date Noted  ? Rupture of membranes with clear amniotic fluid 03/28/2017  ? ? ?History reviewed. No pertinent surgical history. ? ?OB History   ? ? Gravida  ?1  ? Para  ?1  ? Term  ?1  ? Preterm  ?   ? AB  ?   ? Living  ?1  ?  ? ? SAB  ?   ? IAB  ?   ? Ectopic  ?   ? Multiple  ?0  ? Live Births  ?1  ?   ?  ?  ? ? ? ?Home Medications   ? ?Prior to Admission medications   ?Medication Sig Start Date End Date Taking? Authorizing Provider  ?dicyclomine (BENTYL) 20 MG tablet Take 1 tablet (20 mg total) by mouth 2 (two) times daily. 01/08/22  Yes Raylene Everts, MD  ?ondansetron (ZOFRAN-ODT) 8 MG disintegrating tablet Take 8 mg by mouth every 8 (eight) hours as needed for nausea or vomiting.    [provider]  ?Prenatal Vit-Fe Fumarate-FA (PRENATAL MULTIVITAMIN) TABS tablet Take 1 tablet by mouth daily at 12 noon.    [provider]  ? ? ?Family History ?Family History  ?Problem Relation Age of Onset  ? Healthy Mother   ? Diabetes Father   ? Cancer Father   ?     basal cell and melanoma   ? ? ?Social History ?Social History  ? ?Tobacco Use  ? Smoking status: Never  ? Smokeless tobacco: Never  ?Substance Use Topics  ? Alcohol use: Yes  ?  Comment: occ  ? ? ? ?Allergies   ?Corn-containing products, Dairy aid [tilactase], and Gluten meal ? ? ?Review of Systems ?Review of Systems ? ?See HPI ?Physical Exam ?Triage Vital Signs ?ED Triage Vitals  ?Enc Vitals Group  ?   BP 01/08/22 1040 115/75  ?   Pulse Rate 01/08/22 1040 72  ?   Resp 01/08/22 1040 16  ?   Temp 01/08/22 1040 98.4 ?F (36.9 ?C)  ?   Temp Source 01/08/22 1040 Oral  ?   SpO2 01/08/22 1040 99 %  ?   Weight 01/08/22 1042 135 lb (61.2 kg)  ?   Height --   ?   Head Circumference --   ?   Peak Flow --   ?   Pain Score 01/08/22 1042 9  ?   Pain Loc --   ?  Pain Edu? --   ?   Excl. in [place]? --   ? ?No data found. ? ?Updated Vital Signs ?BP 115/75 (BP Location: Right Arm)   Pulse 72   Temp 98.4 ?F (36.9 ?C) (Oral)   Resp 16   Wt 61.2 kg   LMP 12/25/2021 Comment: approx 5 years copper IUD  SpO2 99%   Breastfeeding No   BMI 21.79 kg/m?  ?Physical Exam ?Constitutional:   ?   General: She is not in acute distress. ?   Appearance: She is well-developed and normal weight. She is not ill-appearing.  ?HENT:  ?   Head: Normocephalic and atraumatic.  ?   Mouth/Throat:  ?   Mouth: Mucous membranes are moist.  ?Eyes:  ?   Conjunctiva/sclera: Conjunctivae normal.  ?   Pupils: Pupils are equal, round, and reactive to light.  ?Cardiovascular:  ?   Rate and Rhythm: Normal rate and regular rhythm.  ?   Heart sounds: Normal heart sounds.  ?Pulmonary:  ?   Effort: Pulmonary effort is normal. No respiratory distress.  ?   Breath sounds: Normal breath sounds.  ?Abdominal:  ?   General: Abdomen is flat. Bowel sounds are normal. There is no distension.  ?   Palpations: Abdomen is soft. There is no hepatomegaly or splenomegaly.  ?   Tenderness: There is abdominal tenderness in the periumbilical area. There is no guarding or rebound.  ?Musculoskeletal:     ?   General:  Normal range of motion.  ?   Cervical back: Normal range of motion.  ?Skin: ?   General: Skin is warm and dry.  ?Neurological:  ?   General: No focal deficit present.  ?   Mental Status: She is alert.  ? ? ? ?UC Treatments / Results  ?Labs ?(all labs ordered are listed, but only abnormal results are displayed) ?Labs Reviewed  ?POCT URINALYSIS DIP (MANUAL ENTRY)  ?POCT URINE PREGNANCY  ? ? ?EKG ? ? ?Radiology ?DG Abdomen 1 View ? ?Result Date: 01/08/2022 ?CLINICAL DATA:  Peri umbilical pain EXAM: ABDOMEN - 1 VIEW COMPARISON:  02/25/2020 FINDINGS: Normal bowel gas pattern. Mild-to-moderate stool in the colon. Normal skeletal structures. No urinary tract calculi. IUD noted in the pelvis. IMPRESSION: Normal bowel gas pattern. Mild to moderate retained stool in the colon Electronically Signed   By: Franchot Gallo M.D.   On: 01/08/2022 11:40   ? ?Procedures ?Procedures (including critical care time) ? ?Medications Ordered in UC ?Medications - No data to display ? ?Initial Impression / Assessment and Plan / UC Course  ?I have reviewed the triage vital signs and the nursing notes. ? ?Pertinent labs & imaging results that were available during my care of the patient were reviewed by me and considered in my medical decision making (see chart for details). ? ?  ? ?Urinalysis is negative.  KUB is suggestive for constipation.  Patient appears comfortable although has a high pain tolerance.  Her pain is manageable at this time without additional imaging-I do not feel she has an acute abdomen.  Discussed home care and reasons for return ?Patient denies having similar problems previously, there is an office visit for periumbilical pain and May [id number] where she was evaluated.  Diagnosed with constipation.  Patient is advised ?Final Clinical Impressions(s) / UC Diagnoses  ? ?Final diagnoses:  ?Periumbilical abdominal pain  ?Constipation, unspecified constipation type  ?History of celiac disease  ? ? ? ?Discharge Instructions   ? ?  ?I  have  prescribed Bentyl to try for the abdominal cramping ?I recommend that you take a dose of MiraLAX and increase your water intake ?If you have increasing pain, nausea vomiting, or fever you may need to come back or go to the emergency room for additional imaging ?Sure you are drinking lots of water ? ? ?ED Prescriptions   ? ? Medication Sig Dispense Auth. Provider  ? dicyclomine (BENTYL) 20 MG tablet Take 1 tablet (20 mg total) by mouth 2 (two) times daily. 20 tablet Raylene Everts, MD  ? ?  ? ?PDMP not reviewed this encounter. ?  ?Raylene Everts, MD ?01/08/22 1354 ? ?  ?Raylene Everts, MD ?01/08/22 1356 ? ?

## 2022-07-29 ENCOUNTER — Encounter: Payer: Self-pay | Admitting: Internal Medicine

## 2022-09-14 ENCOUNTER — Encounter: Payer: Self-pay | Admitting: Internal Medicine

## 2022-09-14 ENCOUNTER — Ambulatory Visit (INDEPENDENT_AMBULATORY_CARE_PROVIDER_SITE_OTHER): Payer: BC Managed Care – PPO | Admitting: Internal Medicine

## 2022-09-14 ENCOUNTER — Other Ambulatory Visit (INDEPENDENT_AMBULATORY_CARE_PROVIDER_SITE_OTHER): Payer: BC Managed Care – PPO

## 2022-09-14 VITALS — BP 104/60 | HR 71 | Ht 65.0 in | Wt 138.8 lb

## 2022-09-14 DIAGNOSIS — K900 Celiac disease: Secondary | ICD-10-CM | POA: Diagnosis not present

## 2022-09-14 DIAGNOSIS — Z1211 Encounter for screening for malignant neoplasm of colon: Secondary | ICD-10-CM | POA: Diagnosis not present

## 2022-09-14 DIAGNOSIS — Z83719 Family history of colon polyps, unspecified: Secondary | ICD-10-CM

## 2022-09-14 LAB — CBC WITH DIFFERENTIAL/PLATELET
Basophils Absolute: 0 10*3/uL (ref 0.0–0.1)
Basophils Relative: 0.5 % (ref 0.0–3.0)
Eosinophils Absolute: 0.1 10*3/uL (ref 0.0–0.7)
Eosinophils Relative: 0.8 % (ref 0.0–5.0)
HCT: 39 % (ref 36.0–46.0)
Hemoglobin: 13.3 g/dL (ref 12.0–15.0)
Lymphocytes Relative: 22.1 % (ref 12.0–46.0)
Lymphs Abs: 2.1 10*3/uL (ref 0.7–4.0)
MCHC: 34.1 g/dL (ref 30.0–36.0)
MCV: 88.9 fl (ref 78.0–100.0)
Monocytes Absolute: 0.7 10*3/uL (ref 0.1–1.0)
Monocytes Relative: 7.8 % (ref 3.0–12.0)
Neutro Abs: 6.5 10*3/uL (ref 1.4–7.7)
Neutrophils Relative %: 68.8 % (ref 43.0–77.0)
Platelets: 194 10*3/uL (ref 150.0–400.0)
RBC: 4.39 Mil/uL (ref 3.87–5.11)
RDW: 12.2 % (ref 11.5–15.5)
WBC: 9.4 10*3/uL (ref 4.0–10.5)

## 2022-09-14 LAB — COMPREHENSIVE METABOLIC PANEL
ALT: 23 U/L (ref 0–35)
AST: 18 U/L (ref 0–37)
Albumin: 4.3 g/dL (ref 3.5–5.2)
Alkaline Phosphatase: 45 U/L (ref 39–117)
BUN: 18 mg/dL (ref 6–23)
CO2: 26 mEq/L (ref 19–32)
Calcium: 8.9 mg/dL (ref 8.4–10.5)
Chloride: 101 mEq/L (ref 96–112)
Creatinine, Ser: 1 mg/dL (ref 0.40–1.20)
GFR: 73.23 mL/min (ref >60.00)
Glucose, Bld: 95 mg/dL (ref 70–99)
Potassium: 3.9 mEq/L (ref 3.5–5.1)
Sodium: 135 mEq/L (ref 135–145)
Total Bilirubin: 0.3 mg/dL (ref 0.2–1.2)
Total Protein: 6.8 g/dL (ref 6.0–8.3)

## 2022-09-14 LAB — VITAMIN D 25 HYDROXY (VIT D DEFICIENCY, FRACTURES): VITD: 39.57 ng/mL (ref 30.00–100.00)

## 2022-09-14 LAB — IBC + FERRITIN
Ferritin: 21.7 ng/mL (ref 10.0–291.0)
Iron: 97 ug/dL (ref 42–145)
Saturation Ratios: 30.5 % (ref 20.0–50.0)
TIBC: 317.8 ug/dL (ref 250.0–450.0)
Transferrin: 227 mg/dL (ref 212.0–360.0)

## 2022-09-14 LAB — B12 AND FOLATE PANEL
Folate: 15.6 ng/mL (ref >5.9)
Vitamin B-12: 548 pg/mL (ref 211–911)

## 2022-09-14 MED ORDER — NA SULFATE-K SULFATE-MG SULF 17.5-3.13-1.6 GM/177ML PO SOLN: 1.0000 | Freq: Once | ORAL | 0 refills | Status: AC

## 2022-09-14 NOTE — Patient Instructions (Signed)
Your provider has requested that you go to the basement level for lab work before leaving today. Press "B" on the elevator. The lab is located at the first door on the left as you exit the elevator.  You have been scheduled for a colonoscopy. Please follow written instructions given to you at your visit today.  Please pick up your prep supplies at the pharmacy within the next 1-3 days. If you use inhalers (even only as needed), please bring them with you on the day of your procedure.  _______________________________________________________  If you are age 56 or older, your body mass index should be between 23-30. Your Body mass index is 23.1 kg/m. If this is out of the aforementioned range listed, please consider follow up with your Primary Care Provider.  If you are age 64 or younger, your body mass index should be between 19-25. Your Body mass index is 23.1 kg/m. If this is out of the aformentioned range listed, please consider follow up with your Primary Care Provider.   ________________________________________________________  The [place] [facility] providers would like to encourage you to use [facility] to communicate with providers for non-urgent requests or questions.  Due to long hold times on the telephone, sending your provider a message by [facility] may be a faster and more efficient way to get a response.  Please allow 48 business hours for a response.  Please remember that this is for non-urgent requests.  _______________________________________________________

## 2022-09-14 NOTE — Progress Notes (Signed)
   Chief Complaint: Family history of colon polyps  HPI : 36 year old female with history of celiac disease presents with family history of colon polyps  She was diagnosed with celiac disease about 15 years ago. She follows a gluten free diet and has had any issues if she follows the diet well. Her daughter also has celiac disease. Denies blood in stools, changes in bowel habits, diarrhea, constipation, weight loss, N&V, dysphagia, chest burning, or regurgitation. Denies prior colonoscopy. Mother had a large colon adenoma in her early 9s. Sister had precancerous colon polyps starting at age 31. Patient works as a Patent examiner. She does not follow up with a PCP regularly.  Past Medical History:  Diagnosis Date   TMJ (dislocation of temporomandibular joint)    Past Surgical History:  Procedure Laterality Date   TEMPOROMANDIBULAR JOINT SURGERY  2018   Family History  Problem Relation Age of Onset   Healthy Mother    Diabetes Father    Cancer Father        basal cell and melanoma   Social History   Tobacco Use   Smoking status: Never   Smokeless tobacco: Never  Substance Use Topics   Alcohol use: Yes    Alcohol/week: 7.0 standard drinks of alcohol    Types: 7 Glasses of wine per week    Comment: occ   Drug use: Not Currently   No current outpatient medications on file.   No current facility-administered medications for this visit.   Allergies  Allergen Reactions   Corn-Containing Products Other (See Comments)    GI upset   Dairy Aid [Tilactase] Other (See Comments)    GI upset   Gluten Meal Rash and Other (See Comments)    Mouth swelling and GI upset   Review of Systems: All systems reviewed and negative except where noted in HPI.   Physical Exam: BP 104/60   Pulse 71   Ht 5\' 5"  (1.651 m)   Wt 138 lb 12.8 oz (63 kg)   BMI 23.10 kg/m  Constitutional: Pleasant,well-developed, female in no acute distress. HEENT: Normocephalic and atraumatic.  Conjunctivae are normal. No scleral icterus. Cardiovascular: Normal rate, regular rhythm.  Pulmonary/chest: Effort normal and breath sounds normal. No wheezing, rales or rhonchi. Abdominal: Soft, nondistended, nontender. Bowel sounds active throughout. There are no masses palpable. No hepatomegaly. Extremities: No edema Neurological: Alert and oriented to person place and time. Skin: Skin is warm and dry. No rashes noted. Psychiatric: Normal mood and affect. Behavior is normal.  Labs 03/2020: CBC and CMP unremarkable. Vit D nml. TSH nml.   KUB 01/09/20: IMPRESSION: Normal bowel gas pattern. Mild to moderate retained stool in the colon  ASSESSMENT AND PLAN: Colon cancer screening Family history of colon polyps Celiac disease Patient presents for colon cancer screening due to a family history of advanced adenoma in her mother at age 29. Her sister also had precancerous polyps. Thus she would be due for colon cancer screening. I went over the colonoscopy procedure in detail with the patient, and she is agreeable to proceeding. Since patient has celiac disease and does not follow regularly with a PCP, will get her updated on her routine labs.  - Check CBC, CMP, vitamin D, folate, vitamin B12, ferritin/IBC - Colonoscopy [facility]  41, MD

## 2022-10-28 ENCOUNTER — Encounter: Payer: Self-pay | Admitting: Internal Medicine

## 2022-11-04 ENCOUNTER — Encounter: Payer: Self-pay | Admitting: Internal Medicine

## 2022-11-04 ENCOUNTER — Ambulatory Visit (AMBULATORY_SURGERY_CENTER): Payer: BC Managed Care – PPO | Admitting: Internal Medicine

## 2022-11-04 VITALS — BP 97/56 | HR 61 | Temp 98.2°F | Resp 12 | Ht 65.0 in | Wt 138.0 lb

## 2022-11-04 DIAGNOSIS — Z1211 Encounter for screening for malignant neoplasm of colon: Secondary | ICD-10-CM

## 2022-11-04 DIAGNOSIS — Z800 Family history of malignant neoplasm of digestive organs: Secondary | ICD-10-CM

## 2022-11-04 MED ORDER — SODIUM CHLORIDE 0.9 % IV SOLN: 500.0000 mL | INTRAVENOUS | Status: DC

## 2022-11-04 NOTE — Progress Notes (Signed)
Pt's states no medical or surgical changes since previsit or office visit. 

## 2022-11-04 NOTE — Progress Notes (Signed)
GASTROENTEROLOGY PROCEDURE H&P NOTE   Primary Care Physician: Jefm Petty, MD    Reason for Procedure:   Colon cancer screening, family history of advanced adenoma  Plan:    Colonoscopy  Patient is appropriate for endoscopic procedure(s) in the ambulatory ([place]) setting.  The nature of the procedure, as well as the risks, benefits, and alternatives were carefully and thoroughly reviewed with the patient. Ample time for discussion and questions allowed. The patient understood, was satisfied, and agreed to proceed.     HPI: Debra Raymond is a 37 y.o. female who presents for colonoscopy for evaluation of colon cancer screening and family history of advanced adenoma.  Patient was most recently seen in the Gastroenterology Clinic on 09/14/22.  No interval change in medical history since that appointment. Please refer to that note for full details regarding GI history and clinical presentation.   Past Medical History:  Diagnosis Date   Allergy    Anemia    TMJ (dislocation of temporomandibular joint)     Past Surgical History:  Procedure Laterality Date   TEMPOROMANDIBULAR JOINT SURGERY  2018    Prior to Admission medications   Not on File    No current outpatient medications on file.   Current Facility-Administered Medications  Medication Dose Route Frequency Provider Last Rate Last Admin   0.9 %  sodium chloride infusion  500 mL Intravenous Continuous Sharyn Creamer, MD        Allergies as of 11/04/2022 - Review Complete 11/04/2022  Allergen Reaction Noted   Corn-containing products Other (See Comments) 12/05/2015   Dairy aid [tilactase] Other (See Comments) 12/05/2015   Gluten meal Rash and Other (See Comments) 12/05/2015    Family History  Problem Relation Age of Onset   Colon cancer Mother    Healthy Mother    Diabetes Father    Cancer Father        basal cell and melanoma   Esophageal cancer Neg Hx    Rectal cancer Neg Hx    Stomach cancer Neg Hx      Social History   Socioeconomic History   Marital status: Married    Spouse name: Not on file   Number of children: Not on file   Years of education: Not on file   Highest education level: Not on file  Occupational History   Not on file  Tobacco Use   Smoking status: Never   Smokeless tobacco: Never  Substance and Sexual Activity   Alcohol use: Yes    Alcohol/week: 7.0 standard drinks of alcohol    Types: 7 Glasses of wine per week    Comment: occ   Drug use: Not Currently   Sexual activity: Yes  Other Topics Concern   Not on file  Social History Narrative   Not on file   Social Determinants of Health   Financial Resource Strain: Not on file  Food Insecurity: Not on file  Transportation Needs: Not on file  Physical Activity: Not on file  Stress: Not on file  Social Connections: Not on file  Intimate Partner Violence: Not on file    Physical Exam: Vital signs in last 24 hours: BP 110/74   Pulse 64   Temp 98.2 F (36.8 C)   Ht 5\' 5"  (1.651 m)   Wt 138 lb (62.6 kg)   SpO2 100%   BMI 22.96 kg/m  GEN: NAD EYE: Sclerae anicteric ENT: MMM CV: Non-tachycardic Pulm: No increased WOB GI: Soft NEURO:  Alert &  Oriented   Christia Reading, MD [facility]   11/04/2022 11:05 AM

## 2022-11-04 NOTE — Op Note (Signed)
[place] Patient Name: Debra Raymond Procedure Date: 11/04/2022 11:03 AM MRN: [id number] Endoscopist: Adline Mango [place] , , [id number] Age: 37 Referring MD:  Date of Birth: Jan 18, 1986 Gender: Female Account #: [id number] Procedure:                Colonoscopy Indications:              Colon cancer screening in patient with 1st-degree                            relative having advanced adenoma of the colon                            before age 88 Medicines:                Monitored Anesthesia Care Procedure:                Pre-Anesthesia Assessment:                           - Prior to the procedure, a History and Physical                            was performed, and patient medications and                            allergies were reviewed. The patient's tolerance of                            previous anesthesia was also reviewed. The risks                            and benefits of the procedure and the sedation                            options and risks were discussed with the patient.                            All questions were answered, and informed consent                            was obtained. Prior Anticoagulants: The patient has                            taken no anticoagulant or antiplatelet agents. ASA                            Grade Assessment: I - A normal, healthy patient.                            After reviewing the risks and benefits, the patient                            was deemed in satisfactory condition to undergo the  procedure.                           After obtaining informed consent, the colonoscope                            was passed under direct vision. Throughout the                            procedure, the patient's blood pressure, pulse, and                            oxygen saturations were monitored continuously. The                            PCF-HQ190L Colonoscope was introduced through the                             anus and advanced to the the cecum, identified by                            appendiceal orifice and ileocecal valve. The                            colonoscopy was performed without difficulty. The                            patient tolerated the procedure well. The quality                            of the bowel preparation was good. The ileocecal                            valve, appendiceal orifice, and rectum were                            photographed. Scope In: 11:13:48 AM Scope Out: 11:36:16 AM Scope Withdrawal Time: 0 hours 11 minutes 20 seconds  Total Procedure Duration: 0 hours 22 minutes 28 seconds  Findings:                 Non-bleeding internal hemorrhoids were found during                            retroflexion.                           The exam was otherwise without abnormality. Complications:            No immediate complications. Estimated Blood Loss:     Estimated blood loss: none. Impression:               - Non-bleeding internal hemorrhoids.                           - The examination was otherwise normal.                           -  No specimens collected. Recommendation:           - Discharge patient to home (with escort).                           - Repeat colonoscopy in 5 years for screening                            purposes.                           - The findings and recommendations were discussed                            with the patient. Dr Georgian Co "[place]" Harvest,  11/04/2022 11:38:48 AM

## 2022-11-04 NOTE — Patient Instructions (Signed)
   HANDOUT ON HEMORRHOIDS GIVEN TO YOU TODAY  REPEAT COLONOSCOPY IN 5 YEARS FOR SCREENING    YOU HAD AN ENDOSCOPIC PROCEDURE TODAY AT [facility]:   Refer to the procedure report that was given to you for any specific questions about what was found during the examination.  If the procedure report does not answer your questions, please call your gastroenterologist to clarify.  If you requested that your care partner not be given the details of your procedure findings, then the procedure report has been included in a sealed envelope for you to review at your convenience later.  YOU SHOULD EXPECT: Some feelings of bloating in the abdomen. Passage of more gas than usual.  Walking can help get rid of the air that was put into your GI tract during the procedure and reduce the bloating. If you had a lower endoscopy (such as a colonoscopy or flexible sigmoidoscopy) you may notice spotting of blood in your stool or on the toilet paper. If you underwent a bowel prep for your procedure, you may not have a normal bowel movement for a few days.  Please Note:  You might notice some irritation and congestion in your nose or some drainage.  This is from the oxygen used during your procedure.  There is no need for concern and it should clear up in a day or so.  SYMPTOMS TO REPORT IMMEDIATELY:  Following lower endoscopy (colonoscopy or flexible sigmoidoscopy):  Excessive amounts of blood in the stool  Significant tenderness or worsening of abdominal pains  Swelling of the abdomen that is new, acute  Fever of 100F or higher   For urgent or emergent issues, a gastroenterologist can be reached at any hour by calling [phone]. Do not use MyChart messaging for urgent concerns.    DIET:  We do recommend a small meal at first, but then you may proceed to your regular diet.  Drink plenty of fluids but you should avoid alcoholic beverages for 24 hours.  ACTIVITY:  You should plan to take it  easy for the rest of today and you should NOT DRIVE or use heavy machinery until tomorrow (because of the sedation medicines used during the test).    FOLLOW UP: Our staff will call the number listed on your records the next business day following your procedure.  We will call around 7:15- 8:00 am to check on you and address any questions or concerns that you may have regarding the information given to you following your procedure. If we do not reach you, we will leave a message.     If any biopsies were taken you will be contacted by phone or by letter within the next 1-3 weeks.  Please call us at [phone] if you have not heard about the biopsies in 3 weeks.    SIGNATURES/CONFIDENTIALITY: You and/or your care partner have signed paperwork which will be entered into your electronic medical record.  These signatures attest to the fact that that the information above on your After Visit Summary has been reviewed and is understood.  Full responsibility of the confidentiality of this discharge information lies with you and/or your care-partner.

## 2022-11-05 ENCOUNTER — Telehealth: Payer: Self-pay

## 2022-11-05 NOTE — Telephone Encounter (Signed)
  Follow up Call-     11/04/2022   10:33 AM  Call back number  Post procedure Call Back phone  # [phone]  Permission to leave phone message Yes     Patient questions:  Do you have a fever, pain , or abdominal swelling? No. Pain Score  0 *  Have you tolerated food without any problems? Yes.    Have you been able to return to your normal activities? Yes.    Do you have any questions about your discharge instructions: Diet   No. Medications  No. Follow up visit  No.  Do you have questions or concerns about your Care? No.  Actions: * If pain score is 4 or above: No action needed, pain <4.
